# Patient Record
Sex: Female | Born: 1975 | Race: Black or African American | Hispanic: No | Marital: Married | Smoking: Never smoker
Health system: Southern US, Community
[De-identification: ages and names within clinical notes are randomized; demographics above are authoritative.]

## PROBLEM LIST (undated history)

## (undated) DIAGNOSIS — J302 Other seasonal allergic rhinitis: Secondary | ICD-10-CM

## (undated) DIAGNOSIS — L932 Other local lupus erythematosus: Secondary | ICD-10-CM

## (undated) DIAGNOSIS — E559 Vitamin D deficiency, unspecified: Secondary | ICD-10-CM

## (undated) DIAGNOSIS — T7840XA Allergy, unspecified, initial encounter: Secondary | ICD-10-CM

## (undated) DIAGNOSIS — J3089 Other allergic rhinitis: Secondary | ICD-10-CM

## (undated) DIAGNOSIS — E669 Obesity, unspecified: Secondary | ICD-10-CM

## (undated) DIAGNOSIS — L309 Dermatitis, unspecified: Secondary | ICD-10-CM

## (undated) DIAGNOSIS — K620 Anal polyp: Secondary | ICD-10-CM

## (undated) DIAGNOSIS — L409 Psoriasis, unspecified: Secondary | ICD-10-CM

## (undated) HISTORY — DX: Psoriasis, unspecified: L40.9

## (undated) HISTORY — DX: Other local lupus erythematosus: L93.2

## (undated) HISTORY — DX: Allergy, unspecified, initial encounter: T78.40XA

## (undated) HISTORY — DX: Obesity, unspecified: E66.9

## (undated) HISTORY — DX: Other allergic rhinitis: J30.89

## (undated) HISTORY — DX: Dermatitis, unspecified: L30.9

---
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
## 2000-07-20 ENCOUNTER — Encounter: Payer: Self-pay | Admitting: Emergency Medicine

## 2000-07-20 ENCOUNTER — Emergency Department (HOSPITAL_COMMUNITY): Admission: EM | Admit: 2000-07-20 | Discharge: 2000-07-20 | Payer: Self-pay | Admitting: Emergency Medicine

## 2009-01-15 HISTORY — PX: APPENDECTOMY: SHX54

## 2009-09-15 HISTORY — PX: APPENDECTOMY: SHX54

## 2009-10-12 ENCOUNTER — Inpatient Hospital Stay: Payer: Self-pay | Admitting: Surgery

## 2009-10-14 LAB — PATHOLOGY REPORT

## 2013-01-15 HISTORY — PX: ABDOMINOPLASTY: SUR9

## 2014-05-14 ENCOUNTER — Encounter: Payer: Self-pay | Admitting: *Deleted

## 2014-12-24 ENCOUNTER — Encounter: Payer: Self-pay | Admitting: Family Medicine

## 2015-01-21 ENCOUNTER — Ambulatory Visit (INDEPENDENT_AMBULATORY_CARE_PROVIDER_SITE_OTHER): Payer: 59 | Admitting: Family Medicine

## 2015-01-21 ENCOUNTER — Encounter: Payer: Self-pay | Admitting: Family Medicine

## 2015-01-21 VITALS — BP 122/84 | HR 93 | Temp 98.5°F | Resp 16 | Ht 67.0 in | Wt 235.1 lb

## 2015-01-21 DIAGNOSIS — Z13220 Encounter for screening for lipoid disorders: Secondary | ICD-10-CM

## 2015-01-21 DIAGNOSIS — Z0000 Encounter for general adult medical examination without abnormal findings: Secondary | ICD-10-CM | POA: Insufficient documentation

## 2015-01-21 DIAGNOSIS — J302 Other seasonal allergic rhinitis: Secondary | ICD-10-CM | POA: Insufficient documentation

## 2015-01-21 DIAGNOSIS — J3089 Other allergic rhinitis: Secondary | ICD-10-CM

## 2015-01-21 DIAGNOSIS — Z113 Encounter for screening for infections with a predominantly sexual mode of transmission: Secondary | ICD-10-CM

## 2015-01-21 DIAGNOSIS — R5383 Other fatigue: Secondary | ICD-10-CM | POA: Insufficient documentation

## 2015-01-21 DIAGNOSIS — Z136 Encounter for screening for cardiovascular disorders: Secondary | ICD-10-CM | POA: Diagnosis not present

## 2015-01-21 DIAGNOSIS — E669 Obesity, unspecified: Secondary | ICD-10-CM | POA: Insufficient documentation

## 2015-01-21 DIAGNOSIS — L932 Other local lupus erythematosus: Secondary | ICD-10-CM | POA: Insufficient documentation

## 2015-01-21 DIAGNOSIS — R203 Hyperesthesia: Secondary | ICD-10-CM | POA: Insufficient documentation

## 2015-01-21 DIAGNOSIS — IMO0001 Reserved for inherently not codable concepts without codable children: Secondary | ICD-10-CM | POA: Insufficient documentation

## 2015-01-21 HISTORY — DX: Other allergic rhinitis: J30.89

## 2015-01-21 HISTORY — DX: Other seasonal allergic rhinitis: J30.2

## 2015-01-21 NOTE — Patient Instructions (Signed)
Keep up your efforts for weight loss, you can do it!

## 2015-01-21 NOTE — Progress Notes (Signed)
Name: Aimee Medina   MRN: [id number]    DOB: [birth year]   Date:01/21/2015       Progress Note  Subjective  Chief Complaint  Chief Complaint  Patient presents with  . Annual Exam    HPI  Patient is here today for a Complete Female Physical Exam:  The patient has has no unusual complaints. Overall feels health needs are stable but is working on addressing weight and general health. Before she had children she was around 145 lbs. Diet is well balanced. In general does not exercise regularly but has joined a weight loss challenge. Sees dentist regularly and addresses vision concerns with ophthalmologist if applicable. In regards to sexual activity the patient is currently sexually active. Currently is not concerned about exposure to any STDs.   PAP testing to be done with Gynecologist. Tetanus shot done 05/20/2007 (during her last pregnancy).    Past Medical History  Diagnosis Date  . Allergy   . Dermatitis   . Cutaneous lupus erythematosus     Past Surgical History  Procedure Laterality Date  . Appendectomy  2011    Family History  Problem Relation Age of Onset  . Hypertension Father   . Cancer Father     prostate  . Stroke Father   . Allergies Daughter   . Allergies Son     Social History   Social History  . Marital Status: Single    Spouse Name: N/A  . Number of Children: N/A  . Years of Education: N/A   Occupational History  . Not on file.   Social History Main Topics  . Smoking status: Never Smoker   . Smokeless tobacco: Not on file  . Alcohol Use: No  . Drug Use: No  . Sexual Activity:    Partners: Male   Other Topics Concern  . Not on file   Social History Narrative  . No narrative on file   Outpatient Encounter Prescriptions as of 01/21/2015  Medication Sig  . cetirizine (ZYRTEC) 10 MG tablet Take 10 mg by mouth daily as needed.   No facility-administered encounter medications on file as of 01/21/2015.    No Known Allergies  ROS  CONSTITUTIONAL:  No significant weight changes, fever, chills, weakness. Occasional fatigue. HEENT:  - Eyes: No visual changes.  - Ears: No auditory changes. No pain.  - Nose: No sneezing, congestion, runny nose. - Throat: No sore throat. No changes in swallowing. SKIN: No rash or itching currently. CARDIOVASCULAR: No chest pain, chest pressure or chest discomfort. No palpitations or edema.  RESPIRATORY: No shortness of breath, cough or sputum.  GASTROINTESTINAL: No anorexia, nausea, vomiting. No changes in bowel habits. No abdominal pain or blood.  GENITOURINARY: No dysuria. No frequency. No discharge.  NEUROLOGICAL: No headache, dizziness, syncope, paralysis, ataxia, numbness or tingling in the extremities. No memory changes. No change in bowel or bladder control.  MUSCULOSKELETAL: Occasional knee joint pain. No muscle pain. HEMATOLOGIC: No anemia, bleeding or bruising.  LYMPHATICS: No enlarged lymph nodes.  PSYCHIATRIC: No change in mood. No change in sleep pattern.  ENDOCRINOLOGIC: No reports of sweating, cold or heat intolerance. No polyuria or polydipsia.   Objective  Filed Vitals:   01/21/15 0747  BP: 122/84  Pulse: 93  Temp: 98.5 F (36.9 C)  TempSrc: Oral  Resp: 16  Height: 5\' 7"  (1.702 m)  Weight: 235 lb 1.6 oz (106.641 kg)  SpO2: 96%   Body mass index is 36.81 kg/(m^2).  Depression screen [facility] 2/9  01/21/2015  Decreased Interest 0  Down, Depressed, Hopeless 0  PHQ - 2 Score 0     Physical Exam  Constitutional: Patient appears obese and well-nourished. In no distress.  HEENT:  - Head: Normocephalic and atraumatic.  - Ears: Bilateral TMs gray, no erythema or effusion - Nose: Nasal mucosa moist - Mouth/Throat: Oropharynx is clear and moist. No tonsillar hypertrophy or erythema. No post nasal drainage.  - Eyes: Conjunctivae clear, EOM movements normal. PERRLA. No scleral icterus.  Neck: Normal range of motion. Neck supple. No JVD present. No thyromegaly present.  Cardiovascular:  Normal rate, regular rhythm and normal heart sounds.  No murmur heard.  Pulmonary/Chest: Effort normal and breath sounds normal. No respiratory distress. Abdominal: Soft. Bowel sounds are normal, no distension. There is no tenderness. no masses BREAST: Bilateral breast exam normal with no masses, skin changes or nipple discharge FEMALE GENITALIA: Deferred Musculoskeletal: Normal range of motion bilateral UE and LE, no joint effusions. Peripheral vascular: Bilateral LE no edema. Neurological: CN II-XII grossly intact with no focal deficits. Alert and oriented to person, place, and time. Coordination, balance, strength, speech and gait are normal.  Skin: Skin is warm and dry. No rash noted. No erythema. Right upper back and lower back tattoo.  Psychiatric: Patient has a normal mood and affect. Behavior is normal in office today. Judgment and thought content normal in office today.   Assessment & Plan  1. Annual physical exam Discussed in detail all recommended preventative measures appropriate for age and gender now and in the future.   2. Obesity, Class II, BMI 35-39.9, isolated (HCC) Continue weight loss efforts discussed today.   3. Encounter for cholesteral screening for cardiovascular disease  - Lipid panel  4. Other fatigue Routine blood work, likely multifactorial. Exercise and weight loss will help.  - CBC with Differential/Platelet - Comprehensive metabolic panel - Lipid panel - TSH - HIV antibody  5. Screening for STD (sexually transmitted disease)  - HIV antibody

## 2015-02-01 DIAGNOSIS — R5383 Other fatigue: Secondary | ICD-10-CM | POA: Diagnosis not present

## 2015-02-01 DIAGNOSIS — Z13220 Encounter for screening for lipoid disorders: Secondary | ICD-10-CM | POA: Diagnosis not present

## 2015-02-01 DIAGNOSIS — Z136 Encounter for screening for cardiovascular disorders: Secondary | ICD-10-CM | POA: Diagnosis not present

## 2015-02-01 DIAGNOSIS — Z113 Encounter for screening for infections with a predominantly sexual mode of transmission: Secondary | ICD-10-CM | POA: Diagnosis not present

## 2015-02-02 ENCOUNTER — Encounter: Payer: Self-pay | Admitting: Family Medicine

## 2015-02-02 LAB — COMPREHENSIVE METABOLIC PANEL
ALT: 19 IU/L (ref 0–32)
AST: 22 IU/L (ref 0–40)
Albumin/Globulin Ratio: 1.6 (ref 1.1–2.5)
Albumin: 4.4 g/dL (ref 3.5–5.5)
Alkaline Phosphatase: 76 IU/L (ref 39–117)
BUN/Creatinine Ratio: 12 (ref 8–20)
BUN: 12 mg/dL (ref 6–20)
Bilirubin Total: 0.4 mg/dL (ref 0.0–1.2)
CO2: 22 mmol/L (ref 18–29)
Calcium: 9.2 mg/dL (ref 8.7–10.2)
Chloride: 102 mmol/L (ref 96–106)
Creatinine, Ser: 1.03 mg/dL — ABNORMAL HIGH (ref 0.57–1.00)
GFR calc Af Amer: 80 mL/min/{1.73_m2} (ref >59)
GFR calc non Af Amer: 70 mL/min/{1.73_m2} (ref >59)
Globulin, Total: 2.8 g/dL (ref 1.5–4.5)
Glucose: 81 mg/dL (ref 65–99)
Potassium: 4.7 mmol/L (ref 3.5–5.2)
Sodium: 140 mmol/L (ref 134–144)
Total Protein: 7.2 g/dL (ref 6.0–8.5)

## 2015-02-02 LAB — CBC WITH DIFFERENTIAL/PLATELET
Basophils Absolute: 0 10*3/uL (ref 0.0–0.2)
Basos: 0 %
EOS (ABSOLUTE): 0.1 10*3/uL (ref 0.0–0.4)
Eos: 2 %
Hematocrit: 41.3 % (ref 34.0–46.6)
Hemoglobin: 14 g/dL (ref 11.1–15.9)
Immature Grans (Abs): 0 10*3/uL (ref 0.0–0.1)
Immature Granulocytes: 0 %
Lymphocytes Absolute: 2 10*3/uL (ref 0.7–3.1)
Lymphs: 33 %
MCH: 29.4 pg (ref 26.6–33.0)
MCHC: 33.9 g/dL (ref 31.5–35.7)
MCV: 87 fL (ref 79–97)
Monocytes Absolute: 0.3 10*3/uL (ref 0.1–0.9)
Monocytes: 5 %
Neutrophils Absolute: 3.6 10*3/uL (ref 1.4–7.0)
Neutrophils: 60 %
Platelets: 316 10*3/uL (ref 150–379)
RBC: 4.77 x10E6/uL (ref 3.77–5.28)
RDW: 13.3 % (ref 12.3–15.4)
WBC: 6 10*3/uL (ref 3.4–10.8)

## 2015-02-02 LAB — LIPID PANEL
Chol/HDL Ratio: 3.4 ratio units (ref 0.0–4.4)
Cholesterol, Total: 179 mg/dL (ref 100–199)
HDL: 52 mg/dL (ref >39)
LDL Calculated: 116 mg/dL — ABNORMAL HIGH (ref 0–99)
Triglycerides: 56 mg/dL (ref 0–149)
VLDL Cholesterol Cal: 11 mg/dL (ref 5–40)

## 2015-02-02 LAB — HIV ANTIBODY (ROUTINE TESTING W REFLEX): HIV Screen 4th Generation wRfx: NONREACTIVE

## 2015-02-02 LAB — TSH: TSH: 2.23 u[IU]/mL (ref 0.450–4.500)

## 2015-03-30 ENCOUNTER — Other Ambulatory Visit: Payer: Self-pay | Admitting: Family Medicine

## 2015-03-30 DIAGNOSIS — Z20828 Contact with and (suspected) exposure to other viral communicable diseases: Secondary | ICD-10-CM

## 2015-03-30 MED ORDER — OSELTAMIVIR PHOSPHATE 75 MG PO CAPS: 75.0000 mg | ORAL_CAPSULE | Freq: Every day | ORAL | Status: DC

## 2015-04-15 DIAGNOSIS — H52223 Regular astigmatism, bilateral: Secondary | ICD-10-CM | POA: Diagnosis not present

## 2015-04-15 DIAGNOSIS — H5203 Hypermetropia, bilateral: Secondary | ICD-10-CM | POA: Diagnosis not present

## 2015-06-02 ENCOUNTER — Ambulatory Visit (INDEPENDENT_AMBULATORY_CARE_PROVIDER_SITE_OTHER): Payer: 59 | Admitting: Primary Care

## 2015-06-02 ENCOUNTER — Encounter: Payer: Self-pay | Admitting: Primary Care

## 2015-06-02 VITALS — BP 122/80 | HR 76 | Temp 97.6°F | Ht 67.0 in | Wt 227.0 lb

## 2015-06-02 DIAGNOSIS — R5383 Other fatigue: Secondary | ICD-10-CM | POA: Diagnosis not present

## 2015-06-02 DIAGNOSIS — E669 Obesity, unspecified: Secondary | ICD-10-CM

## 2015-06-02 DIAGNOSIS — R21 Rash and other nonspecific skin eruption: Secondary | ICD-10-CM

## 2015-06-02 LAB — VITAMIN B12: Vitamin B-12: 704 pg/mL (ref 211–911)

## 2015-06-02 MED ORDER — PREDNISONE 10 MG PO TABS: ORAL_TABLET | ORAL | Status: DC

## 2015-06-02 NOTE — Patient Instructions (Signed)
Complete lab work prior to leaving today. I will notify you of your results once received.   Stop by the front desk and speak with either Rosaria Ferries or Ebony Hail regarding your referral to Dermatology.  Start Prednisone tablets for rash. Take 3 tablets for 2 days, then 2 tablets for 2 days, then 1 tablet for 2 days.  Work to reduce processed carbohydrates, sweets, and fast food consumption.   Take a look at the nutritional facts online prior to eating out. A lot of salads and sandwiches that claim to be healthy are loaded with calories.  Ensure you are consuming 64 ounces of water daily.  Try to eat no more than 1400 calories daily in order to lose weight.  Congratulations on your weight loss, keep up the good work!  Follow up in January for your annual physical.  It was a pleasure to meet you today! Please don't hesitate to call me with any questions. Welcome to [facility]!

## 2015-06-02 NOTE — Assessment & Plan Note (Signed)
Endorses fatigue for years. Work up on January unremarkable. Will check vitamin D and B12 today. Exam unremarkable. If labs normal then likely due to her long work hours and family responsibility. Discussed to ensure she is spending time on herself.

## 2015-06-02 NOTE — Progress Notes (Signed)
Subjective:    Patient ID: Aimee Medina, female    DOB: [birth year], 40 y.o.   MRN: [id number]  HPI  Aimee Medina is a 40 year old female who presents today to establish care and discuss the problems mentioned below. Will obtain old records.  1) Obesity: Struggled with for the past 7 years. Over the past 4 months she's been exercising and has improved her diet. She's lost 8 pounds since January but is frustrated today as she has not lost much weight since.   She endorses a healthy diet: Breakfast: Dry cereal, yogurt Lunch: Salad, grilled chicken sandwich, eating out, smoothies Dinner: Chicken, salmon, [place], vegetables, rice Snacks: None Desserts: 2-3 times weekly Beverages: Water  Exercise: Exercise tape at home 2 days weekly, aerobic class once weekly.  2) Rash: Present for the past 2 weeks and is located to her bilateral lower extremities, abdomen, anterior and posterior chest, and face. She called a dermatology office in [facility] and was put on a wait list to be seen. She denies changes in soaps, clothing, food, etc. She's not been working in the yard. She was diagnosed with cutaneous lupus erythematosus in 2014. She's had this rash occur in the past before but not yet to this level. Denies itching. Occasionally painful. She's not applied anything OTC.  3) Fatigue: Present for years. She will feel wake up feeling tired, reports hair loss, and difficulty losing weight. Denies cold inolerance. Labs completed in January including TSH, CBC, CMP were unremarkable. She denies depression. Attributes her fatigue to a full time job and 2 children at home.  Review of Systems  Constitutional: Positive for fever. Negative for unexpected weight change.  Respiratory: Negative for shortness of breath.   Cardiovascular: Negative for chest pain.  Skin: Positive for rash.  Neurological: Negative for dizziness.       Past Medical History  Diagnosis Date  . Allergy   . Dermatitis   . Cutaneous lupus  erythematosus      Social History   Social History  . Marital Status: Married    Spouse Name: N/A  . Number of Children: N/A  . Years of Education: N/A   Occupational History  . Not on file.   Social History Main Topics  . Smoking status: Never Smoker   . Smokeless tobacco: Not on file  . Alcohol Use: No  . Drug Use: No  . Sexual Activity:    Partners: Male   Other Topics Concern  . Not on file   Social History Narrative   Married.   2 children.   Work's at [employer].   Enjoys relaxing, pedicures, spending time with family.     Past Surgical History  Procedure Laterality Date  . Appendectomy  2011    Family History  Problem Relation Age of Onset  . Hypertension Father   . Cancer Father     prostate  . Stroke Father   . Allergies Daughter   . Allergies Son     No Known Allergies  Current Outpatient Prescriptions on File Prior to Visit  Medication Sig Dispense Refill  . cetirizine (ZYRTEC) 10 MG tablet Take 10 mg by mouth daily as needed.     No current facility-administered medications on file prior to visit.    BP 122/80 mmHg  Pulse 76  Temp(Src) 97.6 F (36.4 C) (Oral)  Ht 5\' 7"  (1.702 m)  Wt 227 lb (102.967 kg)  BMI 35.55 kg/m2  SpO2 97%  LMP 05/24/2015  Objective:   Physical Exam  Constitutional: She appears well-nourished.  Neck: Neck supple.  Cardiovascular: Normal rate and regular rhythm.   Pulmonary/Chest: Effort normal and breath sounds normal.  Skin: Skin is warm and dry.  Wide spread rash with darker skin toned spots to lower extremities, anterior and posterior trunk, and face. Flat, no erythema, non tender, no drainage.  Psychiatric: She has a normal mood and affect.          Assessment & Plan:  Rash:  Located wide spread to most of body. Does not appear to be contact dermatitis, cancerous, shingles or allergic reaction. Unsure of etiology. Will treat with prednisone taper and send to dermatology for further  evaluation.

## 2015-06-02 NOTE — Assessment & Plan Note (Signed)
Weight loss of 8 pounds since January, commended her on these efforts and encouraged her to continue. Discussed reduce calorie diet and to read nutritional information for restaurants prior to eating.

## 2015-06-02 NOTE — Progress Notes (Signed)
Pre visit review using our clinic review tool, if applicable. No additional management support is needed unless otherwise documented below in the visit note. 

## 2015-06-03 LAB — VITAMIN D 25 HYDROXY (VIT D DEFICIENCY, FRACTURES): VITD: 13.27 ng/mL — AB (ref 30.00–100.00)

## 2015-06-07 ENCOUNTER — Other Ambulatory Visit: Payer: Self-pay | Admitting: Primary Care

## 2015-06-07 ENCOUNTER — Encounter: Payer: Self-pay | Admitting: Primary Care

## 2015-06-07 DIAGNOSIS — E559 Vitamin D deficiency, unspecified: Secondary | ICD-10-CM

## 2015-06-07 MED ORDER — VITAMIN D (ERGOCALCIFEROL) 1.25 MG (50000 UNIT) PO CAPS: ORAL_CAPSULE | ORAL | Status: DC

## 2015-06-22 DIAGNOSIS — L738 Other specified follicular disorders: Secondary | ICD-10-CM | POA: Diagnosis not present

## 2015-07-16 DIAGNOSIS — G4730 Sleep apnea, unspecified: Secondary | ICD-10-CM | POA: Diagnosis not present

## 2015-07-17 DIAGNOSIS — G4730 Sleep apnea, unspecified: Secondary | ICD-10-CM | POA: Diagnosis not present

## 2015-09-21 ENCOUNTER — Encounter: Payer: Self-pay | Admitting: Primary Care

## 2015-09-21 DIAGNOSIS — L309 Dermatitis, unspecified: Secondary | ICD-10-CM

## 2015-09-28 DIAGNOSIS — L309 Dermatitis, unspecified: Secondary | ICD-10-CM | POA: Diagnosis not present

## 2015-09-28 DIAGNOSIS — L308 Other specified dermatitis: Secondary | ICD-10-CM | POA: Diagnosis not present

## 2015-09-28 DIAGNOSIS — M3210 Systemic lupus erythematosus, organ or system involvement unspecified: Secondary | ICD-10-CM | POA: Diagnosis not present

## 2016-03-27 ENCOUNTER — Other Ambulatory Visit: Payer: Self-pay | Admitting: Primary Care

## 2016-03-27 ENCOUNTER — Encounter: Payer: Self-pay | Admitting: Primary Care

## 2016-03-27 DIAGNOSIS — Z20828 Contact with and (suspected) exposure to other viral communicable diseases: Secondary | ICD-10-CM

## 2016-03-27 MED ORDER — OSELTAMIVIR PHOSPHATE 75 MG PO CAPS: 75.0000 mg | ORAL_CAPSULE | Freq: Every day | ORAL | 0 refills | Status: DC

## 2016-06-12 DIAGNOSIS — H5201 Hypermetropia, right eye: Secondary | ICD-10-CM | POA: Diagnosis not present

## 2016-08-14 ENCOUNTER — Ambulatory Visit (INDEPENDENT_AMBULATORY_CARE_PROVIDER_SITE_OTHER): Payer: 59 | Admitting: Obstetrics & Gynecology

## 2016-08-14 ENCOUNTER — Encounter: Payer: Self-pay | Admitting: Obstetrics & Gynecology

## 2016-08-14 VITALS — BP 131/70 | HR 84 | Ht 67.0 in | Wt 236.0 lb

## 2016-08-14 DIAGNOSIS — Z1151 Encounter for screening for human papillomavirus (HPV): Secondary | ICD-10-CM | POA: Diagnosis not present

## 2016-08-14 DIAGNOSIS — Z113 Encounter for screening for infections with a predominantly sexual mode of transmission: Secondary | ICD-10-CM

## 2016-08-14 DIAGNOSIS — Z01419 Encounter for gynecological examination (general) (routine) without abnormal findings: Secondary | ICD-10-CM | POA: Diagnosis not present

## 2016-08-14 DIAGNOSIS — Z124 Encounter for screening for malignant neoplasm of cervix: Secondary | ICD-10-CM | POA: Diagnosis not present

## 2016-08-14 NOTE — Patient Instructions (Signed)
Preventive Care 18-39 Years, Female Preventive care refers to lifestyle choices and visits with your health care provider that can promote health and wellness. What does preventive care include?  A yearly physical exam. This is also called an annual well check.  Dental exams once or twice a year.  Routine eye exams. Ask your health care provider how often you should have your eyes checked.  Personal lifestyle choices, including: ? Daily care of your teeth and gums. ? Regular physical activity. ? Eating a healthy diet. ? Avoiding tobacco and drug use. ? Limiting alcohol use. ? Practicing safe sex. ? Taking vitamin and mineral supplements as recommended by your health care provider. What happens during an annual well check? The services and screenings done by your health care provider during your annual well check will depend on your age, overall health, lifestyle risk factors, and family history of disease. Counseling Your health care provider may ask you questions about your:  Alcohol use.  Tobacco use.  Drug use.  Emotional well-being.  Home and relationship well-being.  Sexual activity.  Eating habits.  Work and work Statistician.  Method of birth control.  Menstrual cycle.  Pregnancy history.  Screening You may have the following tests or measurements:  Height, weight, and BMI.  Diabetes screening. This is done by checking your blood sugar (glucose) after you have not eaten for a while (fasting).  Blood pressure.  Lipid and cholesterol levels. These may be checked every 5 years starting at age 66.  Skin check.  Hepatitis C blood test.  Hepatitis B blood test.  Sexually transmitted disease (STD) testing.  BRCA-related cancer screening. This may be done if you have a family history of breast, ovarian, tubal, or peritoneal cancers.  Pelvic exam and Pap test. This may be done every 3 years starting at age 40. Starting at age 59, this may be done every 5  years if you have a Pap test in combination with an HPV test.  Discuss your test results, treatment options, and if necessary, the need for more tests with your health care provider. Vaccines Your health care provider may recommend certain vaccines, such as:  Influenza vaccine. This is recommended every year.  Tetanus, diphtheria, and acellular pertussis (Tdap, Td) vaccine. You may need a Td booster every 10 years.  Varicella vaccine. You may need this if you have not been vaccinated.  HPV vaccine. If you are 69 or younger, you may need three doses over 6 months.  Measles, mumps, and rubella (MMR) vaccine. You may need at least one dose of MMR. You may also need a second dose.  Pneumococcal 13-valent conjugate (PCV13) vaccine. You may need this if you have certain conditions and were not previously vaccinated.  Pneumococcal polysaccharide (PPSV23) vaccine. You may need one or two doses if you smoke cigarettes or if you have certain conditions.  Meningococcal vaccine. One dose is recommended if you are age 27-21 years and a first-year college student living in a residence hall, or if you have one of several medical conditions. You may also need additional booster doses.  Hepatitis A vaccine. You may need this if you have certain conditions or if you travel or work in places where you may be exposed to hepatitis A.  Hepatitis B vaccine. You may need this if you have certain conditions or if you travel or work in places where you may be exposed to hepatitis B.  Haemophilus influenzae type b (Hib) vaccine. You may need this if  you have certain risk factors.  Talk to your health care provider about which screenings and vaccines you need and how often you need them. This information is not intended to replace advice given to you by your health care provider. Make sure you discuss any questions you have with your health care provider. Document Released: 02/27/2001 Document Revised: 09/21/2015  Document Reviewed: 11/02/2014 Elsevier Interactive Patient Education  2017 [employer].

## 2016-08-14 NOTE — Progress Notes (Signed)
GYNECOLOGY ANNUAL PREVENTATIVE CARE ENCOUNTER NOTE  Subjective:   Aimee Medina is a 41 y.o. [phone] female here for a routine annual gynecologic exam.  Current complaints: none.   Denies abnormal vaginal bleeding, discharge, pelvic pain, problems with intercourse or other gynecologic concerns.    Gynecologic History Patient's last menstrual period was 07/27/2016. Contraception: none Last Pap: 2016. Results were: normal  Obstetric History OB History  Gravida Para Term Preterm AB Living  4 2 2  0 2 2  SAB TAB Ectopic Multiple Live Births  1 1 0 0 2    # Outcome Date GA Lbr Len/2nd Weight Sex Delivery Anes PTL Lv  4 TAB      TAB     3 SAB      SAB     2 Term      Vag-Spont   LIV  1 Term      Vag-Spont   LIV      Past Medical History:  Diagnosis Date  . Allergy   . Cutaneous lupus erythematosus   . Dermatitis     Past Surgical History:  Procedure Laterality Date  . ABDOMINOPLASTY  2015  . APPENDECTOMY  2011    Current Outpatient Prescriptions on File Prior to Visit  Medication Sig Dispense Refill  . cetirizine (ZYRTEC) 10 MG tablet Take 10 mg by mouth daily as needed.    Marland Kitchen oseltamivir (TAMIFLU) 75 MG capsule Take 1 capsule (75 mg total) by mouth daily. 7 capsule 0  . predniSONE (DELTASONE) 10 MG tablet Take 3 tablets for 2 days, then 2 tablets for 2 days, then 1 tablet for 2 days. 12 tablet 0  . Vitamin D, Ergocalciferol, (DRISDOL) 50000 units CAPS capsule Take 1 capsule by mouth once weekly for 12 weeks. 12 capsule 0   No current facility-administered medications on file prior to visit.     No Known Allergies  Social History   Social History  . Marital status: Married    Spouse name: N/A  . Number of children: N/A  . Years of education: N/A   Occupational History  . Not on file.   Social History Main Topics  . Smoking status: Never Smoker  . Smokeless tobacco: Never Used  . Alcohol use No  . Drug use: No  . Sexual activity: Yes    Partners: Male    Other Topics Concern  . Not on file   Social History Narrative   Married.   2 children.   Work's at [employer].   Enjoys relaxing, pedicures, spending time with family.     Family History  Problem Relation Age of Onset  . Hypertension Father   . Stroke Father   . Prostate cancer Father 38       prostate  . Allergies Daughter   . Allergies Son   . Pancreatic cancer Maternal Grandmother 57  . Colon cancer Maternal Grandfather 50  . Prostate cancer Paternal Grandfather 36    The following portions of the patient's history were reviewed and updated as appropriate: allergies, current medications, past family history, past medical history, past social history, past surgical history and problem list.  Review of Systems Pertinent items noted in HPI and remainder of comprehensive ROS otherwise negative.   Objective:  BP 131/70   Pulse 84   Ht 5\' 7"  (1.702 m)   Wt 236 lb (107 kg)   LMP 07/27/2016   BMI 36.96 kg/m  CONSTITUTIONAL: Well-developed, well-nourished female in no acute distress.  HENT:  Normocephalic, atraumatic, External right and left ear normal. Oropharynx is clear and moist EYES: Conjunctivae and EOM are normal. Pupils are equal, round, and reactive to light. No scleral icterus.  NECK: Normal range of motion, supple, no masses.  Normal thyroid.  SKIN: Skin is warm and dry. No rash noted. Not diaphoretic. No erythema. No pallor. NEUROLOGIC: Alert and oriented to person, place, and time. Normal reflexes, muscle tone coordination. No cranial nerve deficit noted. PSYCHIATRIC: Normal mood and affect. Normal behavior. Normal judgment and thought content. CARDIOVASCULAR: Normal heart rate noted, regular rhythm RESPIRATORY: Clear to auscultation bilaterally. Effort and breath sounds normal, no problems with respiration noted. BREASTS: Symmetric in size. No masses, skin changes, nipple drainage, or lymphadenopathy. ABDOMEN: Soft, normal bowel sounds, no distention noted.   No tenderness, rebound or guarding.  PELVIC: Normal appearing external genitalia; normal appearing vaginal mucosa and cervix.  No abnormal discharge noted.  Pap smear obtained.  Normal uterine size, no other palpable masses, no uterine or adnexal tenderness. MUSCULOSKELETAL: Normal range of motion. No tenderness.  No cyanosis, clubbing, or edema.  2+ distal pulses.   Assessment and Plan:  1. Encounter for gynecological examination with Papanicolaou smear of cervix Patient desired STI screen and other health maintenance labs.  - Cytology - PAP - HIV antibody - Hepatitis B surface antigen - Hepatitis C antibody - RPR - Lipid panel - CBC - CMP and Liver - VITAMIN D 25 Hydroxy (Vit-D Deficiency, Fractures) - TSH Will follow up results of pap smear and manage accordingly. Routine preventative health maintenance measures emphasized. Please refer to After Visit Summary for other counseling recommendations.     Verita Schneiders, MD, [place] Attending Obstetrician & Gynecologist, [place] for [facility]

## 2016-08-15 DIAGNOSIS — Z01419 Encounter for gynecological examination (general) (routine) without abnormal findings: Secondary | ICD-10-CM | POA: Diagnosis not present

## 2016-08-16 LAB — CMP AND LIVER
ALT: 12 IU/L (ref 0–32)
AST: 16 IU/L (ref 0–40)
Albumin: 4.2 g/dL (ref 3.5–5.5)
Alkaline Phosphatase: 82 IU/L (ref 39–117)
BUN: 13 mg/dL (ref 6–20)
Bilirubin Total: 0.5 mg/dL (ref 0.0–1.2)
Bilirubin, Direct: 0.13 mg/dL (ref 0.00–0.40)
CHLORIDE: 100 mmol/L (ref 96–106)
CO2: 23 mmol/L (ref 20–29)
Calcium: 9.1 mg/dL (ref 8.7–10.2)
Creatinine, Ser: 1.04 mg/dL — ABNORMAL HIGH (ref 0.57–1.00)
GFR calc Af Amer: 78 mL/min/{1.73_m2} (ref >59)
GFR calc non Af Amer: 68 mL/min/{1.73_m2} (ref >59)
GLUCOSE: 82 mg/dL (ref 65–99)
POTASSIUM: 5.2 mmol/L (ref 3.5–5.2)
SODIUM: 137 mmol/L (ref 134–144)
Total Protein: 7 g/dL (ref 6.0–8.5)

## 2016-08-16 LAB — RPR: RPR: NONREACTIVE

## 2016-08-16 LAB — HEPATITIS C ANTIBODY: Hep C Virus Ab: <0.1 s/co ratio (ref 0.0–0.9)

## 2016-08-16 LAB — CBC
Hematocrit: 40.5 % (ref 34.0–46.6)
Hemoglobin: 13.4 g/dL (ref 11.1–15.9)
MCH: 29.2 pg (ref 26.6–33.0)
MCHC: 33.1 g/dL (ref 31.5–35.7)
MCV: 88 fL (ref 79–97)
PLATELETS: 297 10*3/uL (ref 150–379)
RBC: 4.59 x10E6/uL (ref 3.77–5.28)
RDW: 13.4 % (ref 12.3–15.4)
WBC: 5.4 10*3/uL (ref 3.4–10.8)

## 2016-08-16 LAB — VITAMIN D 25 HYDROXY (VIT D DEFICIENCY, FRACTURES): VIT D 25 HYDROXY: 21 ng/mL — AB (ref 30.0–100.0)

## 2016-08-16 LAB — LIPID PANEL
CHOLESTEROL TOTAL: 174 mg/dL (ref 100–199)
Chol/HDL Ratio: 3.3 ratio (ref 0.0–4.4)
HDL: 52 mg/dL (ref >39)
LDL Calculated: 109 mg/dL — ABNORMAL HIGH (ref 0–99)
TRIGLYCERIDES: 66 mg/dL (ref 0–149)
VLDL Cholesterol Cal: 13 mg/dL (ref 5–40)

## 2016-08-16 LAB — TSH: TSH: 2.77 u[IU]/mL (ref 0.450–4.500)

## 2016-08-16 LAB — HIV ANTIBODY (ROUTINE TESTING W REFLEX): HIV SCREEN 4TH GENERATION: NONREACTIVE

## 2016-08-16 LAB — HEPATITIS B SURFACE ANTIGEN: HEP B S AG: NEGATIVE

## 2016-08-17 LAB — CYTOLOGY - PAP
Chlamydia: NEGATIVE
DIAGNOSIS: NEGATIVE
DIAGNOSIS: REACTIVE
HPV: NOT DETECTED
Neisseria Gonorrhea: NEGATIVE
Trichomonas: NEGATIVE

## 2017-01-14 ENCOUNTER — Ambulatory Visit (INDEPENDENT_AMBULATORY_CARE_PROVIDER_SITE_OTHER): Payer: 59 | Admitting: Nurse Practitioner

## 2017-01-14 ENCOUNTER — Encounter: Payer: Self-pay | Admitting: Nurse Practitioner

## 2017-01-14 VITALS — BP 122/83 | HR 82 | Temp 98.3°F | Ht 67.0 in | Wt 234.6 lb

## 2017-01-14 DIAGNOSIS — Z30011 Encounter for initial prescription of contraceptive pills: Secondary | ICD-10-CM | POA: Diagnosis not present

## 2017-01-14 DIAGNOSIS — L932 Other local lupus erythematosus: Secondary | ICD-10-CM

## 2017-01-14 DIAGNOSIS — N926 Irregular menstruation, unspecified: Secondary | ICD-10-CM | POA: Diagnosis not present

## 2017-01-14 DIAGNOSIS — E669 Obesity, unspecified: Secondary | ICD-10-CM | POA: Diagnosis not present

## 2017-01-14 LAB — POCT URINE PREGNANCY: Preg Test, Ur: NEGATIVE

## 2017-01-14 MED ORDER — NORETHINDRONE ACET-ETHINYL EST 1-20 MG-MCG PO TABS: 1.0000 | ORAL_TABLET | Freq: Every day | ORAL | 11 refills | Status: DC

## 2017-01-14 MED ORDER — NORETHIN-ETH ESTRAD-FE BIPHAS 1 MG-10 MCG / 10 MCG PO TABS: 1.0000 | ORAL_TABLET | Freq: Every day | ORAL | 11 refills | Status: DC

## 2017-01-14 NOTE — Patient Instructions (Addendum)
Aimee Medina, Thank you for coming in to clinic today.  1. For your irregular periods: - Continue tracking your cycle. - START lo-loestrin 1-10 once daily.  Repeat your pills by taking in order by pill pack.  Start with new pack every 28 days.  2. Weight loss - Dr. Migdalia Dk office should contact you.  3. Referral to dermatology for continuing care has been placed.   Please schedule a follow-up appointment with [name], AGNP. Return in about 7 months (around 08/14/2017) for Annual Physical.  If you have any other questions or concerns, please feel free to call the clinic or send a message through [place]. You may also schedule an earlier appointment if necessary.  You will receive a survey after today's visit either digitally by e-mail or paper by [employer]. Your experiences and feedback matter to [place].  Please respond so we know how we are doing as we provide care for you.   [name], DNP, AGNP-BC Adult Gerontology Nurse Practitioner [place]

## 2017-01-14 NOTE — Progress Notes (Signed)
Subjective:    Patient ID: Aimee Medina, female    DOB: [birth year], 41 y.o.   MRN: [id number]  Aimee Medina is a 41 y.o. female presenting on 01/14/2017 for Establish Care (need referral weight loss program Dr. Leafy Ro, referral Dermatology Dr. Phillip Heal)   HPI [facility] Provider Pt last seen by PCP about 2 years ago.  Obtain records from [facility].  Pt's last PCP was Alma Friendly, NP at [facility]. Likes [employer] as well and needed to get into network for new focus plan insurance.  Obesity More significant weight gain steady over last 6 years with move to [place] from [place] and had much more supportive environment. - Pt would like to start the program for weight loss with Dr. Leafy Ro from family medicine weight management practice.  The program is based on lifestyle changes and regular followup frequently for account.   Dermatology: diagnosed with lupus  - feels is somewhat connected to weight gain since she didn't have symptoms prior to weight gain.  Plaquenil caused dizziness, so she stopped. Is not currently bothered by skin symptoms.  No other current or prior manifestations of lupus are noted.  Irregular Menses Pt reports new onset of irregular menses over last 6 months. Now has very irregular cycle.  Pt not currently using contraception and does not desire additional children.  Has not liked symptoms from standard hormonal methods in past, so requests very low dose hormone pill today. Social History   Tobacco Use  . Smoking status: Never Smoker  . Smokeless tobacco: Never Used  Substance Use Topics  . Alcohol use: No    Alcohol/week: 0.0 oz    Comment: very rare  . Drug use: No    Review of Systems  Constitutional: Positive for fatigue.  HENT: Negative.   Respiratory: Negative.  Negative for shortness of breath.   Cardiovascular: Negative for chest pain and leg swelling.  Gastrointestinal: Negative for constipation and diarrhea.  Endocrine: Negative.   Genitourinary:  Positive for menstrual problem (irregular menses). Negative for difficulty urinating.  Musculoskeletal: Negative.   Skin: Positive for color change and rash.  Allergic/Immunologic: Positive for environmental allergies.  Psychiatric/Behavioral: Negative.    Per HPI unless specifically indicated above     Objective:    BP 122/83 (BP Location: Right Arm, Patient Position: Sitting, Cuff Size: Normal)   Pulse 82   Temp 98.3 F (36.8 C) (Oral)   Ht 5\' 7"  (1.702 m)   Wt 234 lb 9.6 oz (106.4 kg)   LMP 12/03/2016 (Within Days) Comment: not currently on contraception  BMI 36.74 kg/m   Wt Readings from Last 3 Encounters:  01/14/17 234 lb 9.6 oz (106.4 kg)  08/14/16 236 lb (107 kg)  06/02/15 227 lb (103 kg)    Physical Exam  General - obese, well-appearing, NAD HEENT - Normocephalic, atraumatic Neck - supple, non-tender, no LAD Heart - RRR, no murmurs heard Lungs - Clear throughout all lobes, no wheezing, crackles, or rhonchi. Normal work of breathing. Extremeties - non-tender, trace pedal edema R>L, cap refill < 2 seconds, peripheral pulses intact +2 bilaterally Skin - warm, dry, lupus changes to skin on face, abdomen and back. 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  No other current signs of systemic lupus. Pt requests referral for continuation of dermatology care on new [employer] insurance plan.  Plan: 1. Referral Dr. Aubery Lapping dermatology. 2.  Discussed need to continue to monitor kidney function with increased creatinine for possibility of systemic lupus. 3.  Follow-up with annual labs in  July.   Relevant Orders   Ambulatory referral to Dermatology     Other   Obesity, Class II, BMI 35-39.9, isolated Currently in obesity class II without comorbidities.  Patient motivated for weight loss and desires to be referred to medical weight management with Dr. Leafy Ro in [place].  Plan: 1.  Referral to Dr. Leafy Ro for medical weight management. 2.  Encourage patient to continue lifestyle management. 3.  Follow-up as needed.   Relevant Orders   Amb Ref to Medical Weight Management    Other Visit Diagnoses    Encounter for initial prescription of contraceptive pills    -  Primary Irregular menses    Patient reports irregular menses over the last 6 months.  Has not had a menstrual period in at least 6 weeks.  Patient not currently using contraception and does not desire any other pregnancies. -Possible early menopause  Plan: 1.  Start lo-Loestrin once daily times 21 days then take an extra pills for 7 days.  Start new pack after 28 days.  -Prior authorization will be over acquired with failure of to generic OCPs.   - Discontinue lo-Loestrin. Start Loestrin. 2.  POCT pregnancy test today result negative 3.  Follow-up annually.   Relevant Medications   norethindrone-ethinyl estradiol (LOESTRIN 1/20, 21,) 1-20 MG-MCG tablet   Other Relevant Orders   POCT urine pregnancy (Completed)          Meds ordered this encounter  Medications  . DISCONTD: Norethindrone-Ethinyl Estradiol-Fe Biphas (LO LOESTRIN FE) 1 MG-10 MCG / 10 MCG tablet    Sig: Take 1 tablet by mouth daily.    Dispense:  1 Package    Refill:  11    Order Specific Question:   Supervising Provider    Answer:   Olin Hauser [[id number]]  . norethindrone-ethinyl estradiol (LOESTRIN 1/20, 21,) 1-20 MG-MCG tablet    Sig: Take 1 tablet by mouth daily.    Dispense:  1 Package    Refill:  11    Order Specific Question:   Supervising Provider    Answer:   Olin Hauser [[id number]]     Follow up  plan: Return in about 7 months (around 08/14/2017) for Annual Physical.   [name], DNP, AGPCNP-BC Adult Gerontology Primary Care Nurse Practitioner [facility] 01/14/2017, 4:55 PM

## 2017-03-13 ENCOUNTER — Encounter (INDEPENDENT_AMBULATORY_CARE_PROVIDER_SITE_OTHER): Payer: Self-pay

## 2017-03-21 ENCOUNTER — Encounter (INDEPENDENT_AMBULATORY_CARE_PROVIDER_SITE_OTHER): Payer: Self-pay

## 2017-03-21 ENCOUNTER — Ambulatory Visit (INDEPENDENT_AMBULATORY_CARE_PROVIDER_SITE_OTHER): Payer: Self-pay | Admitting: Family Medicine

## 2017-03-27 ENCOUNTER — Ambulatory Visit (INDEPENDENT_AMBULATORY_CARE_PROVIDER_SITE_OTHER): Payer: No Typology Code available for payment source | Admitting: Family Medicine

## 2017-03-27 ENCOUNTER — Encounter (INDEPENDENT_AMBULATORY_CARE_PROVIDER_SITE_OTHER): Payer: Self-pay | Admitting: Family Medicine

## 2017-03-27 VITALS — BP 136/86 | HR 75 | Temp 98.0°F | Ht 67.0 in | Wt 228.0 lb

## 2017-03-27 DIAGNOSIS — E559 Vitamin D deficiency, unspecified: Secondary | ICD-10-CM

## 2017-03-27 DIAGNOSIS — Z9189 Other specified personal risk factors, not elsewhere classified: Secondary | ICD-10-CM | POA: Diagnosis not present

## 2017-03-27 DIAGNOSIS — R5383 Other fatigue: Secondary | ICD-10-CM | POA: Diagnosis not present

## 2017-03-27 DIAGNOSIS — R0602 Shortness of breath: Secondary | ICD-10-CM

## 2017-03-27 DIAGNOSIS — Z6835 Body mass index (BMI) 35.0-35.9, adult: Secondary | ICD-10-CM | POA: Diagnosis not present

## 2017-03-27 DIAGNOSIS — Z0289 Encounter for other administrative examinations: Secondary | ICD-10-CM

## 2017-03-27 DIAGNOSIS — Z1331 Encounter for screening for depression: Secondary | ICD-10-CM

## 2017-03-27 HISTORY — DX: Vitamin D deficiency, unspecified: E55.9

## 2017-03-27 NOTE — Progress Notes (Signed)
.  Office: [phone]  /  Fax: [phone]   HPI:   Chief Complaint: Aimee Medina (MR# [id number]) is a 42 y.o. female who presents on 03/27/2017 for obesity evaluation and treatment. Current BMI is Body mass index is 35.71 kg/m.Aimee Medina has struggled with obesity for years and has been unsuccessful in either losing weight or maintaining long term weight loss. Aimee Medina attended our information session and states she is currently in the action stage of change and ready to dedicate time achieving and maintaining a healthier weight.  Aimee Medina states her family eats meals together she thinks her family will eat healthier with  her her desired weight loss is 67 lbs she started gaining weight after 2nd pregnancy her heaviest weight ever was 237 lbs. she is a picky eater and doesn't like to eat healthier foods  she has significant food cravings issues  she skips meals frequently she is frequently drinking liquids with calories she frequently makes poor food choices she frequently eats larger portions than normal  she has binge eating behaviors she struggles with emotional eating   Aimee Medina had abdominoplasty and mistakenly thinks this was weight loss surgery.   Aimee Medina feels her energy is lower than it should be. This has worsened with weight gain and has not worsened recently. Aimee Medina admits to daytime somnolence and admits to waking up still tired. Patient is at risk for obstructive sleep apnea. Patent has a history of symptoms of daytime Aimee, morning Aimee and morning headache. Patient generally gets 5 or 6 hours of sleep per night, and states they generally have restful sleep. Snoring is present. Apneic episodes are not present. Epworth Sleepiness Score is 8  Dyspnea on exertion Aimee Medina notes increasing shortness of breath with exercising and seems to be worsening over time with weight gain. She notes getting out of breath sooner with activity than she used to. This has not gotten worse  recently. Aimee Medina denies orthopnea.  Vitamin D deficiency Aimee Medina has a diagnosis of vitamin D deficiency. She is not currently taking vit D. Aimee Medina admits Aimee and denies nausea, vomiting or muscle weakness.   Ref. Range 08/15/2016 08:52  Vitamin D, 25-Hydroxy Latest Ref Range: 30.0 - 100.0 ng/mL 21.0 (L)   At risk for cardiovascular disease Aimee Medina is at a higher than average risk for cardiovascular disease due to obesity. She currently denies any chest pain.  Depression Screen Aimee Medina's Food and Mood (modified PHQ-9) score was  Depression screen PHQ 2/9 03/27/2017  Decreased Interest 3  Down, Depressed, Hopeless 3  PHQ - 2 Score 6  Altered sleeping 3  Tired, decreased energy 3  Change in appetite 1  Feeling bad or failure about yourself  3  Trouble concentrating 2  Moving slowly or fidgety/restless 0  Suicidal thoughts 0  PHQ-9 Score 18  Difficult doing work/chores Somewhat difficult    ALLERGIES: No Known Allergies  MEDICATIONS: Current Outpatient Medications on File Prior to Visit  Medication Sig Dispense Refill  . cetirizine (ZYRTEC) 10 MG tablet Take 10 mg by mouth daily as needed.    . norethindrone-ethinyl estradiol (LOESTRIN 1/20, 21,) 1-20 MG-MCG tablet Take 1 tablet by mouth daily. 1 Package 11   No current facility-administered medications on file prior to visit.     PAST MEDICAL HISTORY: Past Medical History:  Diagnosis Date  . Allergy   . Cutaneous lupus erythematosus   . Dermatitis   . Obesity   . Perennial allergic rhinitis   . Psoriasis  PAST SURGICAL HISTORY: Past Surgical History:  Procedure Laterality Date  . ABDOMINOPLASTY  2015  . APPENDECTOMY  2011    SOCIAL HISTORY: Social History   Tobacco Use  . Smoking status: Never Smoker  . Smokeless tobacco: Never Used  Substance Use Topics  . Alcohol use: No    Alcohol/week: 0.0 oz    Comment: very rare  . Drug use: No    FAMILY HISTORY: Family History  Problem Relation Age of Onset  .  High blood pressure Mother   . Heart disease Mother   . Stroke Mother   . Hypertension Father   . Stroke Father   . Prostate cancer Father 31       prostate- currently in remission  . High Cholesterol Father   . Allergies Daughter   . Allergies Son   . Pancreatic cancer Maternal Grandmother 29  . Colon cancer Maternal Grandfather 9  . Prostate cancer Paternal Grandfather 62    ROS: Review of Systems  Constitutional: Positive for malaise/Aimee.  Cardiovascular: Negative for chest pain and orthopnea.  Gastrointestinal: Negative for nausea and vomiting.  Musculoskeletal:       Negative for muscle weakness  Skin: Positive for rash.  Neurological: Positive for headaches.    PHYSICAL EXAM: Blood pressure 136/86, pulse 75, temperature 98 F (36.7 C), temperature source Oral, height 5\' 7"  (1.702 m), weight 228 lb (103.4 kg), last menstrual period 03/25/2017, SpO2 100 %. Body mass index is 35.71 kg/m. Physical Exam  Constitutional: She is oriented to person, place, and time. She appears well-developed and well-nourished.  HENT:  Head: Normocephalic and atraumatic.  Nose: Nose normal.  Eyes: EOM are normal. No scleral icterus.  Neck: Normal range of motion. Neck supple. No thyromegaly present.  Cardiovascular: Normal rate.  Murmur heard.  Systolic (early) murmur is present with a grade of 1/6. Pulmonary/Chest: Effort normal. No respiratory distress.  Abdominal: Soft. There is no tenderness.  +obesity  Musculoskeletal: Normal range of motion.  Range of Motion normal in all 4 extremities  Neurological: She is alert and oriented to person, place, and time. Coordination normal.  Skin: Skin is warm and dry.  Psychiatric: She has a normal mood and affect. Her behavior is normal.  Vitals reviewed.   RECENT LABS AND TESTS: BMET    Component Value Date/Time   NA 137 08/15/2016 0852   K 5.2 08/15/2016 0852   CL 100 08/15/2016 0852   CO2 23 08/15/2016 0852   GLUCOSE 82  08/15/2016 0852   BUN 13 08/15/2016 0852   CREATININE 1.04 (H) 08/15/2016 0852   CALCIUM 9.1 08/15/2016 0852   GFRNONAA 68 08/15/2016 0852   GFRAA 78 08/15/2016 0852   No results found for: HGBA1C No results found for: INSULIN CBC    Component Value Date/Time   WBC 5.4 08/15/2016 0852   RBC 4.59 08/15/2016 0852   HGB 13.4 08/15/2016 0852   HCT 40.5 08/15/2016 0852   PLT 297 08/15/2016 0852   MCV 88 08/15/2016 0852   MCH 29.2 08/15/2016 0852   MCHC 33.1 08/15/2016 0852   RDW 13.4 08/15/2016 0852   LYMPHSABS 2.0 02/01/2015 1149   EOSABS 0.1 02/01/2015 1149   BASOSABS 0.0 02/01/2015 1149   Iron/TIBC/Ferritin/ %Sat No results found for: IRON, TIBC, FERRITIN, IRONPCTSAT Lipid Panel     Component Value Date/Time   CHOL 174 08/15/2016 0852   TRIG 66 08/15/2016 0852   HDL 52 08/15/2016 0852   CHOLHDL 3.3 08/15/2016 0852   LDLCALC  109 (H) 08/15/2016 0852   Hepatic Function Panel     Component Value Date/Time   PROT 7.0 08/15/2016 0852   ALBUMIN 4.2 08/15/2016 0852   AST 16 08/15/2016 0852   ALT 12 08/15/2016 0852   ALKPHOS 82 08/15/2016 0852   BILITOT 0.5 08/15/2016 0852   BILIDIR 0.13 08/15/2016 0852      Component Value Date/Time   TSH 2.770 08/15/2016 0852   Vitamin D   Ref. Range 08/15/2016 08:52  Vitamin D, 25-Hydroxy Latest Ref Range: 30.0 - 100.0 ng/mL 21.0 (L)    ECG  shows NSR with a rate of 75 BPM INDIRECT CALORIMETER done today shows a VO2 of 258 and a REE of 1795. Her calculated basal metabolic rate is 2297 thus her basal metabolic rate is better than expected.    ASSESSMENT AND PLAN: Other Aimee - Plan: EKG 12-Lead, CBC With Differential, Vitamin B12, Comprehensive metabolic panel, Folate, Hemoglobin A1c, Insulin, random, Lipid Panel With LDL/HDL Ratio, T3, T4, free, TSH  Shortness of breath on exertion - Plan: CBC With Differential  Vitamin D deficiency - Plan: VITAMIN D 25 Hydroxy (Vit-D Deficiency, Fractures)  Depression screening  At  risk for heart disease  Class 2 severe obesity with serious comorbidity and body mass index (BMI) of 35.0 to 35.9 in adult, unspecified obesity type (HCC)  PLAN:  Aimee Aimee Medina was informed that her Aimee may be related to obesity, depression or many other causes. Labs will be ordered, and in the meanwhile Aimee Medina has agreed to work on diet, exercise and weight loss to help with Aimee. Proper sleep hygiene was discussed including the need for 7-8 hours of quality sleep each night. A sleep study was not ordered based on symptoms and Epworth score.  Dyspnea on exertion Aimee Medina's shortness of breath appears to be obesity related and exercise induced. She has agreed to work on weight loss and gradually increase exercise to treat her exercise induced shortness of breath. If Aimee Medina follows our instructions and loses weight without improvement of her shortness of breath, we will plan to refer to pulmonology. We will monitor this condition regularly. Aimee Medina agrees to this plan.  Vitamin D Deficiency Aimee Medina was informed that low vitamin D levels contributes to Aimee and are associated with obesity, breast, and colon cancer. We will check labs and she will follow up for routine testing of vitamin D, at least 2-3 times per year. Aimee Medina agrees to follow up with our clinic in 2 weeks.  Cardiovascular risk counseling Aimee Medina was given extended (15 minutes) coronary artery disease prevention counseling today. She is 42 y.o. female and has risk factors for heart disease including obesity. We discussed intensive lifestyle modifications today with an emphasis on specific weight loss instructions and strategies. Pt was also informed of the importance of increasing exercise and decreasing saturated fats to help prevent heart disease.  Depression Screen Aimee Medina had a strongly positive depression screening. Depression is commonly associated with obesity and often results in emotional eating behaviors. We will monitor this closely  and work on CBT to help improve the non-hunger eating patterns. Referral to Psychology may be required if no improvement is seen as she continues in our clinic.  Obesity Aimee Medina is currently in the action stage of change and her goal is to continue with weight loss efforts She has agreed to follow the Category 2 plan +100 calories Aimee Medina has been instructed to work up to a goal of 150 minutes of combined cardio and strengthening exercise per week  for weight loss and overall health benefits. We discussed the following Behavioral Modification Strategies today: increasing lean protein intake, decreasing simple carbohydrates  and work on meal planning and easy cooking plans  Aimee Medina has agreed to follow up with our clinic in 2 weeks. She was informed of the importance of frequent follow up visits to maximize her success with intensive lifestyle modifications for her multiple health conditions. She was informed we would discuss her lab results at her next visit unless there is a critical issue that needs to be addressed sooner. Horace agreed to keep her next visit at the agreed upon time to discuss these results.    OBESITY BEHAVIORAL INTERVENTION VISIT  Today's visit was # 1 out of 22.  Starting weight: 228 lbs Starting date: 03/27/17 Today's weight : 228 lbs Today's date: 03/27/2017 Total lbs lost to date: 0 (Patients must lose 7 lbs in the first 6 months to continue with counseling)   ASK: We discussed the diagnosis of obesity with Andria Frames [place] today and Arieal agreed to give [place] permission to discuss obesity behavioral modification therapy today.  ASSESS: Vinie has the diagnosis of obesity and her BMI today is 35.7 Alayzia is in the action stage of change   ADVISE: Rhylen was educated on the multiple health risks of obesity as well as the benefit of weight loss to improve her health. She was advised of the need for long term treatment and the importance of lifestyle modifications.  AGREE: Multiple  dietary modification options and treatment options were discussed and  Tempie agreed to the above obesity treatment plan.   I, Doreene Nest, am acting as transcriptionist for Dennard Nip, MD   I have reviewed the above documentation for accuracy and completeness, and I agree with the above. -Dennard Nip, MD

## 2017-03-28 LAB — COMPREHENSIVE METABOLIC PANEL
A/G RATIO: 1.4 (ref 1.2–2.2)
ALBUMIN: 3.9 g/dL (ref 3.5–5.5)
ALT: 16 IU/L (ref 0–32)
AST: 18 IU/L (ref 0–40)
Alkaline Phosphatase: 78 IU/L (ref 39–117)
BILIRUBIN TOTAL: 0.3 mg/dL (ref 0.0–1.2)
BUN / CREAT RATIO: 12 (ref 9–23)
BUN: 10 mg/dL (ref 6–24)
CHLORIDE: 103 mmol/L (ref 96–106)
CO2: 19 mmol/L — ABNORMAL LOW (ref 20–29)
Calcium: 8.6 mg/dL — ABNORMAL LOW (ref 8.7–10.2)
Creatinine, Ser: 0.84 mg/dL (ref 0.57–1.00)
GFR calc non Af Amer: 87 mL/min/{1.73_m2} (ref >59)
GFR, EST AFRICAN AMERICAN: 101 mL/min/{1.73_m2} (ref >59)
Globulin, Total: 2.7 g/dL (ref 1.5–4.5)
Glucose: 76 mg/dL (ref 65–99)
Potassium: 3.9 mmol/L (ref 3.5–5.2)
Sodium: 138 mmol/L (ref 134–144)
Total Protein: 6.6 g/dL (ref 6.0–8.5)

## 2017-03-28 LAB — LIPID PANEL WITH LDL/HDL RATIO
CHOLESTEROL TOTAL: 175 mg/dL (ref 100–199)
HDL: 53 mg/dL (ref >39)
LDL CALC: 107 mg/dL — AB (ref 0–99)
LDl/HDL Ratio: 2 ratio (ref 0.0–3.2)
Triglycerides: 73 mg/dL (ref 0–149)
VLDL CHOLESTEROL CAL: 15 mg/dL (ref 5–40)

## 2017-03-28 LAB — CBC WITH DIFFERENTIAL
BASOS ABS: 0 10*3/uL (ref 0.0–0.2)
BASOS: 0 %
EOS (ABSOLUTE): 0.1 10*3/uL (ref 0.0–0.4)
Eos: 1 %
Hematocrit: 40.6 % (ref 34.0–46.6)
Hemoglobin: 13.7 g/dL (ref 11.1–15.9)
Immature Grans (Abs): 0 10*3/uL (ref 0.0–0.1)
Immature Granulocytes: 0 %
Lymphocytes Absolute: 1.9 10*3/uL (ref 0.7–3.1)
Lymphs: 29 %
MCH: 30 pg (ref 26.6–33.0)
MCHC: 33.7 g/dL (ref 31.5–35.7)
MCV: 89 fL (ref 79–97)
MONOS ABS: 0.4 10*3/uL (ref 0.1–0.9)
Monocytes: 6 %
NEUTROS ABS: 4.1 10*3/uL (ref 1.4–7.0)
NEUTROS PCT: 64 %
RBC: 4.56 x10E6/uL (ref 3.77–5.28)
RDW: 14.3 % (ref 12.3–15.4)
WBC: 6.4 10*3/uL (ref 3.4–10.8)

## 2017-03-28 LAB — T4, FREE: Free T4: 1.29 ng/dL (ref 0.82–1.77)

## 2017-03-28 LAB — TSH: TSH: 1.8 u[IU]/mL (ref 0.450–4.500)

## 2017-03-28 LAB — FOLATE: Folate: 12.8 ng/mL (ref >3.0)

## 2017-03-28 LAB — VITAMIN B12: Vitamin B-12: 695 pg/mL (ref 232–1245)

## 2017-03-28 LAB — HEMOGLOBIN A1C

## 2017-03-28 LAB — T3: T3, Total: 101 ng/dL (ref 71–180)

## 2017-03-28 LAB — INSULIN, RANDOM: INSULIN: 9.4 u[IU]/mL (ref 2.6–24.9)

## 2017-03-28 LAB — VITAMIN D 25 HYDROXY (VIT D DEFICIENCY, FRACTURES): Vit D, 25-Hydroxy: 15.7 ng/mL — ABNORMAL LOW (ref 30.0–100.0)

## 2017-04-02 ENCOUNTER — Ambulatory Visit (INDEPENDENT_AMBULATORY_CARE_PROVIDER_SITE_OTHER): Payer: Self-pay | Admitting: Family Medicine

## 2017-04-15 ENCOUNTER — Ambulatory Visit (INDEPENDENT_AMBULATORY_CARE_PROVIDER_SITE_OTHER): Payer: No Typology Code available for payment source | Admitting: Family Medicine

## 2017-04-15 VITALS — BP 126/87 | HR 85 | Temp 98.2°F | Ht 67.0 in | Wt 225.0 lb

## 2017-04-15 DIAGNOSIS — E88819 Insulin resistance, unspecified: Secondary | ICD-10-CM

## 2017-04-15 DIAGNOSIS — E559 Vitamin D deficiency, unspecified: Secondary | ICD-10-CM

## 2017-04-15 DIAGNOSIS — E8881 Metabolic syndrome: Secondary | ICD-10-CM | POA: Diagnosis not present

## 2017-04-15 DIAGNOSIS — Z6835 Body mass index (BMI) 35.0-35.9, adult: Secondary | ICD-10-CM | POA: Diagnosis not present

## 2017-04-15 DIAGNOSIS — Z9189 Other specified personal risk factors, not elsewhere classified: Secondary | ICD-10-CM | POA: Diagnosis not present

## 2017-04-15 DIAGNOSIS — E66812 Obesity, class 2: Secondary | ICD-10-CM

## 2017-04-15 MED ORDER — VITAMIN D (ERGOCALCIFEROL) 1.25 MG (50000 UNIT) PO CAPS: 50000.0000 [IU] | ORAL_CAPSULE | ORAL | 0 refills | Status: DC

## 2017-04-15 NOTE — Progress Notes (Signed)
Office: [phone]  /  Fax: [phone]   HPI:   Chief Complaint: OBESITY Aimee Medina is here to discuss her progress with her obesity treatment plan. She is on the  follow the Category 2 plan +100 calories and is following her eating plan approximately 80 % of the time. She states she is walking for 30 minutes 3 times per week. [name] has done well with weight loss. She has followed her plan better during the week and she struggled over the weekends with various kids activities.Hunger was mostly controlled. Her weight is 225 lb (102.1 kg) today and has had a weight loss of 3 pounds over a period of 2 weeks since her last visit. She has lost 3 lbs since starting treatment with [place].  Vitamin D deficiency [name] has a diagnosis of vitamin D deficiency. [name] is not on vit D and she admits fatigue. She had been on vit D prescription weekly, but she never reached vit D goal. Blaze denies nausea, vomiting or muscle weakness.  Insulin Resistance (new) [name] has a new diagnosis of insulin resistance. She has normal glucose, but elevated fasting insulin level >5. Hgb A1c was unable to be obtained due to lab error.Although [name]'s blood glucose readings are still under good control, insulin resistance puts her at greater risk of metabolic syndrome and diabetes. She is not taking metformin currently and continues to work on diet and exercise to decrease risk of diabetes.  At risk for diabetes [name] is at higher than average risk for developing diabetes due to her obesity and insulin resistance. She currently denies polyuria or polydipsia.  ALLERGIES: No Known Allergies  MEDICATIONS: Current Outpatient Medications on File Prior to Visit  Medication Sig Dispense Refill  . cetirizine (ZYRTEC) 10 MG tablet Take 10 mg by mouth daily as needed.    . norethindrone-ethinyl estradiol (LOESTRIN 1/20, 21,) 1-20 MG-MCG tablet Take 1 tablet by mouth daily. 1 Package 11   No current facility-administered medications on  file prior to visit.     PAST MEDICAL HISTORY: Past Medical History:  Diagnosis Date  . Allergy   . Cutaneous lupus erythematosus   . Dermatitis   . Obesity   . Perennial allergic rhinitis   . Psoriasis     PAST SURGICAL HISTORY: Past Surgical History:  Procedure Laterality Date  . ABDOMINOPLASTY  2015  . APPENDECTOMY  2011    SOCIAL HISTORY: Social History   Tobacco Use  . Smoking status: Never Smoker  . Smokeless tobacco: Never Used  Substance Use Topics  . Alcohol use: No    Alcohol/week: 0.0 oz    Comment: very rare  . Drug use: No    FAMILY HISTORY: Family History  Problem Relation Age of Onset  . High blood pressure Mother   . Heart disease Mother   . Stroke Mother   . Hypertension Father   . Stroke Father   . Prostate cancer Father 40       prostate- currently in remission  . High Cholesterol Father   . Allergies Daughter   . Allergies Son   . Pancreatic cancer Maternal Grandmother 15  . Colon cancer Maternal Grandfather 62  . Prostate cancer Paternal Grandfather 70    ROS: Review of Systems  Constitutional: Positive for malaise/fatigue and weight loss.  Gastrointestinal: Negative for nausea and vomiting.  Genitourinary: Negative for frequency.  Musculoskeletal:       Negative for muscle weakness  Endo/Heme/Allergies: Negative for polydipsia.    PHYSICAL EXAM: Blood  pressure 126/87, pulse 85, temperature 98.2 F (36.8 C), temperature source Oral, height 5\' 7"  (1.702 m), weight 225 lb (102.1 kg), last menstrual period 03/25/2017, SpO2 98 %. Body mass index is 35.24 kg/m. Physical Exam  Constitutional: She is oriented to person, place, and time. She appears well-developed and well-nourished.  Cardiovascular: Normal rate.  Pulmonary/Chest: Effort normal.  Musculoskeletal: Normal range of motion.  Neurological: She is oriented to person, place, and time.  Skin: Skin is warm and dry.  Psychiatric: She has a normal mood and affect. Her  behavior is normal.  Vitals reviewed.   RECENT LABS AND TESTS: BMET    Component Value Date/Time   NA 138 03/27/2017 1131   K 3.9 03/27/2017 1131   CL 103 03/27/2017 1131   CO2 19 (L) 03/27/2017 1131   GLUCOSE 76 03/27/2017 1131   BUN 10 03/27/2017 1131   CREATININE 0.84 03/27/2017 1131   CALCIUM 8.6 (L) 03/27/2017 1131   GFRNONAA 87 03/27/2017 1131   GFRAA 101 03/27/2017 1131   Lab Results  Component Value Date   HGBA1C CANCELED 03/27/2017   Lab Results  Component Value Date   INSULIN 9.4 03/27/2017   CBC    Component Value Date/Time   WBC 6.4 03/27/2017 1131   RBC 4.56 03/27/2017 1131   HGB 13.7 03/27/2017 1131   HCT 40.6 03/27/2017 1131   PLT 297 08/15/2016 0852   MCV 89 03/27/2017 1131   MCH 30.0 03/27/2017 1131   MCHC 33.7 03/27/2017 1131   RDW 14.3 03/27/2017 1131   LYMPHSABS 1.9 03/27/2017 1131   EOSABS 0.1 03/27/2017 1131   BASOSABS 0.0 03/27/2017 1131   Iron/TIBC/Ferritin/ %Sat No results found for: IRON, TIBC, FERRITIN, IRONPCTSAT Lipid Panel     Component Value Date/Time   CHOL 175 03/27/2017 1131   TRIG 73 03/27/2017 1131   HDL 53 03/27/2017 1131   CHOLHDL 3.3 08/15/2016 0852   LDLCALC 107 (H) 03/27/2017 1131   Hepatic Function Panel     Component Value Date/Time   PROT 6.6 03/27/2017 1131   ALBUMIN 3.9 03/27/2017 1131   AST 18 03/27/2017 1131   ALT 16 03/27/2017 1131   ALKPHOS 78 03/27/2017 1131   BILITOT 0.3 03/27/2017 1131   BILIDIR 0.13 08/15/2016 0852      Component Value Date/Time   TSH 1.800 03/27/2017 1131   TSH 2.770 08/15/2016 0852   TSH 2.230 02/01/2015 1149   Results for Harb, Salina R (MRN [id number]) as of 04/15/2017 15:59  Ref. Range 03/27/2017 11:31  Vitamin D, 25-Hydroxy Latest Ref Range: 30.0 - 100.0 ng/mL 15.7 (L)   ASSESSMENT AND PLAN: Vitamin D deficiency - Plan: Vitamin D, Ergocalciferol, (DRISDOL) 50000 units CAPS capsule  Insulin resistance  At risk for diabetes mellitus  Class 2 severe obesity with  serious comorbidity and body mass index (BMI) of 35.0 to 35.9 in adult, unspecified obesity type ([place])  PLAN:  Vitamin D Deficiency [name] was informed that low vitamin D levels contributes to fatigue and are associated with obesity, breast, and colon cancer. She agrees to continue to take prescription Vit D @50 ,000 IU every week and will follow up for routine testing of vitamin D, at least 2-3 times per year. She was informed of the risk of over-replacement of vitamin D and agrees to not increase her dose unless she discusses this with [place] first.  Insulin Resistance Batsheva will continue to work on weight loss, exercise, and decreasing simple carbohydrates in her diet to help decrease the risk  of diabetes. She was informed that eating too many simple carbohydrates or too many calories at one sitting increases the likelihood of GI side effects.  We will defer metformin and will recheck labs in 3 months. Rivka agreed to follow up with [place] as directed to monitor her progress.  Diabetes risk counseling Isela was given extended (30 minutes) diabetes prevention counseling today. She is 42 y.o. female and has risk factors for diabetes including obesity and insulin resistance. We discussed intensive lifestyle modifications today with an emphasis on weight loss as well as increasing exercise and decreasing simple carbohydrates in her diet.  Obesity Sundra is currently in the action stage of change. As such, her goal is to continue with weight loss efforts She has agreed to follow the Category 2 plan +100 calories Shalee has been instructed to work up to a goal of 150 minutes of combined cardio and strengthening exercise per week for weight loss and overall health benefits. We discussed the following Behavioral Modification Strategies today: increasing lean protein intake, work on meal planning and easy cooking plans, dealing with family or coworker sabotage and travel eating strategies   Abrianna has agreed to follow up  with our clinic in 2 weeks. She was informed of the importance of frequent follow up visits to maximize her success with intensive lifestyle modifications for her multiple health conditions.   OBESITY BEHAVIORAL INTERVENTION VISIT  Today's visit was # 2 out of 22.  Starting weight: 228 lbs Starting date: 03/27/17 Today's weight : 225 lbs Today's date: 04/15/2017 Total lbs lost to date: 3 (Patients must lose 7 lbs in the first 6 months to continue with counseling)   ASK: We discussed the diagnosis of obesity with Andria Frames [place] today and Hallelujah agreed to give [place] permission to discuss obesity behavioral modification therapy today.  ASSESS: Elenore has the diagnosis of obesity and her BMI today is 35.23 Malaiyah is in the action stage of change   ADVISE: Breean was educated on the multiple health risks of obesity as well as the benefit of weight loss to improve her health. She was advised of the need for long term treatment and the importance of lifestyle modifications.  AGREE: Multiple dietary modification options and treatment options were discussed and  Maral agreed to the above obesity treatment plan.  I, Doreene Nest, am acting as transcriptionist for Dennard Nip, MD  I have reviewed the above documentation for accuracy and completeness, and I agree with the above. -Dennard Nip, MD

## 2017-04-17 ENCOUNTER — Ambulatory Visit (INDEPENDENT_AMBULATORY_CARE_PROVIDER_SITE_OTHER): Payer: No Typology Code available for payment source | Admitting: Family Medicine

## 2017-04-29 ENCOUNTER — Ambulatory Visit (INDEPENDENT_AMBULATORY_CARE_PROVIDER_SITE_OTHER): Payer: No Typology Code available for payment source | Admitting: Family Medicine

## 2017-04-29 VITALS — BP 117/80 | HR 77 | Temp 98.4°F | Ht 67.0 in | Wt 223.0 lb

## 2017-04-29 DIAGNOSIS — Z9189 Other specified personal risk factors, not elsewhere classified: Secondary | ICD-10-CM | POA: Diagnosis not present

## 2017-04-29 DIAGNOSIS — E559 Vitamin D deficiency, unspecified: Secondary | ICD-10-CM | POA: Diagnosis not present

## 2017-04-29 DIAGNOSIS — Z6835 Body mass index (BMI) 35.0-35.9, adult: Secondary | ICD-10-CM | POA: Diagnosis not present

## 2017-04-29 NOTE — Progress Notes (Signed)
Office: [phone]  /  Fax: [phone]   HPI:   Chief Complaint: OBESITY Aimee Medina is here to discuss her progress with her obesity treatment plan. She is on the Category 2 plan + 100 calories and is following her eating plan approximately 70 % of the time. She states she is walking for 30 minutes 3 times per week. Aimee Medina continues to do well with weight loss, she does well with Category 2 plan during the week but struggles over the weekend. She does especially well with meal planning.  Her weight is 223 lb (101.2 kg) today and has had a weight loss of 2 pounds over a period of 2 weeks since her last visit. She has lost 5 lbs since starting treatment with [place].  Vitamin D Deficiency Aimee Medina has a diagnosis of vitamin D deficiency. She is on Vit D prescription 2 times per week and denies nausea, vomiting or muscle weakness. Last level was low and she notes fatigue.  At risk for osteopenia and osteoporosis Aimee Medina is at higher risk of osteopenia and osteoporosis due to vitamin D deficiency.   ALLERGIES: No Known Allergies  MEDICATIONS: Current Outpatient Medications on File Prior to Visit  Medication Sig Dispense Refill  . cetirizine (ZYRTEC) 10 MG tablet Take 10 mg by mouth daily as needed.    . norethindrone-ethinyl estradiol (LOESTRIN 1/20, 21,) 1-20 MG-MCG tablet Take 1 tablet by mouth daily. 1 Package 11  . Vitamin D, Ergocalciferol, (DRISDOL) 50000 units CAPS capsule Take 1 capsule (50,000 Units total) by mouth every 3 (three) days. 10 capsule 0   No current facility-administered medications on file prior to visit.     PAST MEDICAL HISTORY: Past Medical History:  Diagnosis Date  . Allergy   . Cutaneous lupus erythematosus   . Dermatitis   . Obesity   . Perennial allergic rhinitis   . Psoriasis     PAST SURGICAL HISTORY: Past Surgical History:  Procedure Laterality Date  . ABDOMINOPLASTY  2015  . APPENDECTOMY  2011    SOCIAL HISTORY: Social History   Tobacco Use  .  Smoking status: Never Smoker  . Smokeless tobacco: Never Used  Substance Use Topics  . Alcohol use: No    Alcohol/week: 0.0 oz    Comment: very rare  . Drug use: No    FAMILY HISTORY: Family History  Problem Relation Age of Onset  . High blood pressure Mother   . Heart disease Mother   . Stroke Mother   . Hypertension Father   . Stroke Father   . Prostate cancer Father 78       prostate- currently in remission  . High Cholesterol Father   . Allergies Daughter   . Allergies Son   . Pancreatic cancer Maternal Grandmother 11  . Colon cancer Maternal Grandfather 62  . Prostate cancer Paternal Grandfather 52    ROS: Review of Systems  Constitutional: Positive for malaise/fatigue and weight loss.  Gastrointestinal: Negative for nausea and vomiting.  Musculoskeletal:       Negative muscle weakness    PHYSICAL EXAM: Blood pressure 117/80, pulse 77, temperature 98.4 F (36.9 C), temperature source Oral, height 5\' 7"  (1.702 m), weight 223 lb (101.2 kg), SpO2 98 %. Body mass index is 34.93 kg/m. Physical Exam  Constitutional: She is oriented to person, place, and time. She appears well-developed and well-nourished.  Cardiovascular: Normal rate.  Pulmonary/Chest: Effort normal.  Musculoskeletal: Normal range of motion.  Neurological: She is oriented to person, place, and time.  Skin: Skin is warm and dry.  Psychiatric: She has a normal mood and affect. Her behavior is normal.  Vitals reviewed.   RECENT LABS AND TESTS: BMET    Component Value Date/Time   NA 138 03/27/2017 1131   K 3.9 03/27/2017 1131   CL 103 03/27/2017 1131   CO2 19 (L) 03/27/2017 1131   GLUCOSE 76 03/27/2017 1131   BUN 10 03/27/2017 1131   CREATININE 0.84 03/27/2017 1131   CALCIUM 8.6 (L) 03/27/2017 1131   GFRNONAA 87 03/27/2017 1131   GFRAA 101 03/27/2017 1131   Lab Results  Component Value Date   HGBA1C CANCELED 03/27/2017   Lab Results  Component Value Date   INSULIN 9.4 03/27/2017    CBC    Component Value Date/Time   WBC 6.4 03/27/2017 1131   RBC 4.56 03/27/2017 1131   HGB 13.7 03/27/2017 1131   HCT 40.6 03/27/2017 1131   PLT 297 08/15/2016 0852   MCV 89 03/27/2017 1131   MCH 30.0 03/27/2017 1131   MCHC 33.7 03/27/2017 1131   RDW 14.3 03/27/2017 1131   LYMPHSABS 1.9 03/27/2017 1131   EOSABS 0.1 03/27/2017 1131   BASOSABS 0.0 03/27/2017 1131   Iron/TIBC/Ferritin/ %Sat No results found for: IRON, TIBC, FERRITIN, IRONPCTSAT Lipid Panel     Component Value Date/Time   CHOL 175 03/27/2017 1131   TRIG 73 03/27/2017 1131   HDL 53 03/27/2017 1131   CHOLHDL 3.3 08/15/2016 0852   LDLCALC 107 (H) 03/27/2017 1131   Hepatic Function Panel     Component Value Date/Time   PROT 6.6 03/27/2017 1131   ALBUMIN 3.9 03/27/2017 1131   AST 18 03/27/2017 1131   ALT 16 03/27/2017 1131   ALKPHOS 78 03/27/2017 1131   BILITOT 0.3 03/27/2017 1131   BILIDIR 0.13 08/15/2016 0852      Component Value Date/Time   TSH 1.800 03/27/2017 1131   TSH 2.770 08/15/2016 0852   TSH 2.230 02/01/2015 1149  Results for Medina, Aimee R (MRN [id number]) as of 04/29/2017 08:10  Ref. Range 03/27/2017 11:31  Vitamin D, 25-Hydroxy Latest Ref Range: 30.0 - 100.0 ng/mL 15.7 (L)    ASSESSMENT AND PLAN: Vitamin D deficiency  At risk for osteoporosis  Class 2 severe obesity with serious comorbidity and body mass index (BMI) of 35.0 to 35.9 in adult, unspecified obesity type (Aimee Medina)  PLAN:  Vitamin D Deficiency Aimee Medina was informed that low vitamin D levels contributes to fatigue and are associated with obesity, breast, and colon cancer. Aimee Medina agrees to continue taking prescription Vit D @50 ,000 IU 2 times every week and will follow up for routine testing of vitamin D, at least 2-3 times per year. She was informed of the risk of over-replacement of vitamin D and agrees to not increase her dose unless she discusses this with [place] first. We will recheck labs in 6 weeks and Aimee Medina agrees to follow up with our  clinic in 2 to 3 weeks.  At risk for osteopenia and osteoporosis Aimee Medina is at risk for osteopenia and osteoporsis due to her vitamin D deficiency. She was encouraged to take her vitamin D and follow her higher calcium diet and increase strengthening exercise to help strengthen her bones and decrease her risk of osteopenia and osteoporosis.  Obesity Aimee Medina is currently in the action stage of change. As such, her goal is to continue with weight loss efforts She has agreed to keep a food journal with 250-350 calories and 20 grams of protein at breakfast daily  and follow the Category 2 plan + 100 calories Aimee Medina has been instructed to work up to a goal of 150 minutes of combined cardio and strengthening exercise per week for weight loss and overall health benefits. We discussed the following Behavioral Modification Strategies today: increasing lean protein intake, travel eating strategies, and holiday eating strategies    Aimee Medina has agreed to follow up with our clinic in 2 to 3 weeks. She was informed of the importance of frequent follow up visits to maximize her success with intensive lifestyle modifications for her multiple health conditions.   OBESITY BEHAVIORAL INTERVENTION VISIT  Today's visit was # 3 out of 22.  Starting weight: 228 lbs Starting date: 03/27/17 Today's weight : 223 lbs Today's date: 04/29/2017 Total lbs lost to date: 5 (Patients must lose 7 lbs in the first 6 months to continue with counseling)   ASK: We discussed the diagnosis of obesity with Aimee Medina today and Aimee Medina agreed to give [place] permission to discuss obesity behavioral modification therapy today.  ASSESS: Aimee Medina has the diagnosis of obesity and her BMI today is 34.92 Aimee Medina is in the action stage of change   ADVISE: Aimee Medina was educated on the multiple health risks of obesity as well as the benefit of weight loss to improve her health. She was advised of the need for long term treatment and the importance of lifestyle  modifications.  AGREE: Multiple dietary modification options and treatment options were discussed and  Aimee Medina agreed to the above obesity treatment plan.  I, Aimee Medina, am acting as transcriptionist for Aimee Nip, MD  I have reviewed the above documentation for accuracy and completeness, and I agree with the above. -Aimee Nip, MD

## 2017-05-20 ENCOUNTER — Ambulatory Visit (INDEPENDENT_AMBULATORY_CARE_PROVIDER_SITE_OTHER): Payer: No Typology Code available for payment source | Admitting: Family Medicine

## 2017-05-20 ENCOUNTER — Encounter (INDEPENDENT_AMBULATORY_CARE_PROVIDER_SITE_OTHER): Payer: Self-pay

## 2017-05-20 ENCOUNTER — Encounter (INDEPENDENT_AMBULATORY_CARE_PROVIDER_SITE_OTHER): Payer: Self-pay | Admitting: Family Medicine

## 2017-05-20 NOTE — Telephone Encounter (Signed)
Please R/S

## 2017-05-23 ENCOUNTER — Ambulatory Visit (INDEPENDENT_AMBULATORY_CARE_PROVIDER_SITE_OTHER): Payer: Self-pay | Admitting: Family Medicine

## 2017-05-23 ENCOUNTER — Encounter: Payer: Self-pay | Admitting: Family Medicine

## 2017-05-23 VITALS — BP 130/90 | HR 73 | Temp 97.2°F | Wt 229.0 lb

## 2017-05-23 DIAGNOSIS — R0982 Postnasal drip: Secondary | ICD-10-CM

## 2017-05-23 DIAGNOSIS — J029 Acute pharyngitis, unspecified: Secondary | ICD-10-CM

## 2017-05-23 DIAGNOSIS — H6983 Other specified disorders of Eustachian tube, bilateral: Secondary | ICD-10-CM

## 2017-05-23 MED ORDER — LEVOCETIRIZINE DIHYDROCHLORIDE 5 MG PO TABS: 5.0000 mg | ORAL_TABLET | Freq: Every evening | ORAL | 1 refills | Status: DC

## 2017-05-23 MED ORDER — IPRATROPIUM BROMIDE 0.03 % NA SOLN: 2.0000 | Freq: Two times a day (BID) | NASAL | 0 refills | Status: DC

## 2017-05-23 NOTE — Patient Instructions (Signed)

## 2017-05-23 NOTE — Progress Notes (Signed)
Patient ID: Aimee Medina, female    DOB: [birth year], 42 y.o.   MRN: [id number]  PCP: Mikey College, NP  Chief Complaint  Patient presents with  . focus sorethroat/ear pain    Subjective:  HPI Aimee Medina is a 42 y.o. female presents for evaluation of sore throat and ear pain. Current symptoms have been present for 4 days. Reports a history of allergies for which she chronically takes Zyrtec which have not improved current symptoms. Reports bilateral ear pressure and the sensation of "fluid" draining in the ear. She also complain of soreness of throat which progresses as the day goes on. Throat soreness is exacerbated by drinking and eating. Throat pain improves with lozenges. Problem has remained unchanged over the course of the last 4 days. Denies fever, shortness of breath, wheezing, coughing, rhinorrhea, sinus congestion, or sinus pressure.  Social History   Socioeconomic History  . Marital status: Married    Spouse name: Aimee Medina  . Number of children: 2  . Years of education: bachelors  . Highest education level: Not on file  Occupational History  . Occupation: Acupuncturist  Social Needs  . Financial resource strain: Not hard at all  . Food insecurity:    Worry: Never true    Inability: Never true  . Transportation needs:    Medical: No    Non-medical: No  Tobacco Use  . Smoking status: Never Smoker  . Smokeless tobacco: Never Used  Substance and Sexual Activity  . Alcohol use: No    Alcohol/week: 0.0 oz    Comment: very rare  . Drug use: No  . Sexual activity: Yes    Partners: Male  Lifestyle  . Physical activity:    Days per week: 1 day    Minutes per session: 40 min  . Stress: Only a little  Relationships  . Social connections:    Talks on phone: More than three times a week    Gets together: Three times a week    Attends religious service: More than 4 times per year    Active member of club or organization: Yes    Attends meetings of clubs or  organizations: More than 4 times per year    Relationship status: Married  . Intimate partner violence:    Fear of current or ex partner: No    Emotionally abused: No    Physically abused: No    Forced sexual activity: No  Other Topics Concern  . Not on file  Social History Narrative   Married.   2 children.   Enjoys relaxing, pedicures, spending time with family.     Family History  Problem Relation Age of Onset  . High blood pressure Mother   . Heart disease Mother   . Stroke Mother   . Hypertension Father   . Stroke Father   . Prostate cancer Father 72       prostate- currently in remission  . High Cholesterol Father   . Allergies Daughter   . Allergies Son   . Pancreatic cancer Maternal Grandmother 4  . Colon cancer Maternal Grandfather 45  . Prostate cancer Paternal Grandfather 9   Review of Systems Pertinent negatives listed in HPI  Patient Active Problem List   Diagnosis Date Noted  . Shortness of breath on exertion 03/27/2017  . Vitamin D deficiency 03/27/2017  . Seasonal and perennial allergic rhinitis 01/21/2015  . Obesity, Class II, BMI 35-39.9, isolated 01/21/2015  . Annual physical exam  01/21/2015  . Other fatigue 01/21/2015  . Cutaneous lupus erythematosus 01/21/2015   No Known Allergies  Prior to Admission medications   Medication Sig Start Date End Date Taking? Authorizing Provider  cetirizine (ZYRTEC) 10 MG tablet Take 10 mg by mouth daily as needed.   Yes [provider]  HALOG 0.1 % OINT APPLY TOPICALLY TO THE AFFECTED AREA EVERY DAY 03/08/17  Yes [provider]  norethindrone-ethinyl estradiol (LOESTRIN 1/20, 21,) 1-20 MG-MCG tablet Take 1 tablet by mouth daily. 01/14/17  Yes Mikey College, NP  Vitamin D, Ergocalciferol, (DRISDOL) 50000 units CAPS capsule Take 50,000 Units by mouth every 7 (seven) days.   Yes [provider]    Past Medical, Surgical Family and Social History reviewed and updated.     Objective:   Today's Vitals   05/23/17 1215  BP: 130/90  Pulse: 73  Temp: (!) 97.2 F (36.2 C)  SpO2: 100%  Weight: 229 lb (103.9 kg)    Wt Readings from Last 3 Encounters:  05/23/17 229 lb (103.9 kg)  04/29/17 223 lb (101.2 kg)  04/15/17 225 lb (102.1 kg)   Physical Exam  Constitutional: She is oriented to person, place, and time. She appears well-developed and well-nourished.  HENT:  Head: Normocephalic and atraumatic.  Right Ear: Hearing, tympanic membrane, external ear and ear canal normal.  Left Ear: Hearing, tympanic membrane and external ear normal.  Nose: Mucosal edema present. Right sinus exhibits no maxillary sinus tenderness and no frontal sinus tenderness. Left sinus exhibits no maxillary sinus tenderness and no frontal sinus tenderness.  Mouth/Throat: Posterior oropharyngeal erythema present.  Cardiovascular: Normal rate, regular rhythm and normal heart sounds.  Pulmonary/Chest: Breath sounds normal.  Musculoskeletal: Normal range of motion.  Neurological: She is oriented to person, place, and time.  Psychiatric: She has a normal mood and affect. Her behavior is normal. Judgment and thought content normal.    Assessment & Plan:  1. Eustachian tube dysfunction, bilateral 2. PND (post-nasal drip) 3. Sore throat Suspect cluster of symptoms are related to seasonal and environmental allergies. Currently prescribed chronic cetrizine, however symptoms have remained persistent despite current treatment. Will trial Levocetirizine 5 mg daily and Atrovent nasal spray, 2 sprays twice daily for a minimum of 7-10 days. If no improvement of symptoms, I will consider a short-course of low dose prednisone. Patient may also benefit from a referral to ENT if this problem continues.     If symptoms worsen or do not improve, return for follow-up, follow-up with PCP, or at the emergency department if severity of symptoms warrant a higher level of care.     Carroll Sage. Kenton Kingfisher, MSN,  FNP-C [facility]  [address], [place] [address] [phone]

## 2017-05-27 ENCOUNTER — Telehealth: Payer: Self-pay | Admitting: Emergency Medicine

## 2017-05-27 NOTE — Telephone Encounter (Signed)
Left message follow up call with visit with [facility].

## 2017-05-29 ENCOUNTER — Encounter: Payer: Self-pay | Admitting: Radiology

## 2017-07-25 ENCOUNTER — Encounter: Payer: Self-pay | Admitting: Nurse Practitioner

## 2017-07-25 ENCOUNTER — Ambulatory Visit (INDEPENDENT_AMBULATORY_CARE_PROVIDER_SITE_OTHER): Payer: No Typology Code available for payment source | Admitting: Nurse Practitioner

## 2017-07-25 VITALS — BP 125/79 | HR 70 | Temp 98.3°F | Ht 67.0 in | Wt 229.6 lb

## 2017-07-25 DIAGNOSIS — Z1231 Encounter for screening mammogram for malignant neoplasm of breast: Secondary | ICD-10-CM | POA: Diagnosis not present

## 2017-07-25 DIAGNOSIS — E559 Vitamin D deficiency, unspecified: Secondary | ICD-10-CM | POA: Diagnosis not present

## 2017-07-25 DIAGNOSIS — Z23 Encounter for immunization: Secondary | ICD-10-CM | POA: Diagnosis not present

## 2017-07-25 DIAGNOSIS — Z0000 Encounter for general adult medical examination without abnormal findings: Secondary | ICD-10-CM | POA: Diagnosis not present

## 2017-07-25 DIAGNOSIS — Z1239 Encounter for other screening for malignant neoplasm of breast: Secondary | ICD-10-CM

## 2017-07-25 NOTE — Progress Notes (Signed)
Subjective:    Patient ID: Aimee Medina, female    DOB: Dec 20, 1975, 42 y.o.   MRN: [id number]  Aimee Medina is a 42 y.o. female presenting on 07/25/2017 for Annual Exam   HPI  Annual Physical Exam Patient has been feeling well.  They have no acute concerns today. Sleeps 6-7 hours per night uninterrupted.  HEALTH MAINTENANCE: Weight/BMI: stable, has seen  Physical activity: Walking 20 minutes 3 days per week Diet: Generally healthy (reducing sugars, sweet tea/ increasing protein intake) PAP: up to date Mammogram: due Optometry: regular Dentistry: regular  VACCINES: Tetanus: due Influenza: yearly with work Gardasil: declines - requested more info  Past Medical History:  Diagnosis Date  . Allergy   . Cutaneous lupus erythematosus   . Dermatitis   . Obesity   . Perennial allergic rhinitis   . Psoriasis    Past Surgical History:  Procedure Laterality Date  . ABDOMINOPLASTY  2015  . APPENDECTOMY  2011   Social History   Socioeconomic History  . Marital status: Married    Spouse name: Leone Payor  . Number of children: 2  . Years of education: bachelors  . Highest education level: Not on file  Occupational History  . Occupation: Acupuncturist  Social Needs  . Financial resource strain: Not hard at all  . Food insecurity:    Worry: Never true    Inability: Never true  . Transportation needs:    Medical: No    Non-medical: No  Tobacco Use  . Smoking status: Never Smoker  . Smokeless tobacco: Never Used  Substance and Sexual Activity  . Alcohol use: No    Alcohol/week: 0.0 oz    Comment: very rare  . Drug use: No  . Sexual activity: Yes    Partners: Male    Birth control/protection: Condom  Lifestyle  . Physical activity:    Days per week: 1 day    Minutes per session: 40 min  . Stress: Only a little  Relationships  . Social connections:    Talks on phone: More than three times a week    Gets together: Three times a week    Attends religious  service: More than 4 times per year    Active member of club or organization: Yes    Attends meetings of clubs or organizations: More than 4 times per year    Relationship status: Married  . Intimate partner violence:    Fear of current or ex partner: No    Emotionally abused: No    Physically abused: No    Forced sexual activity: No  Other Topics Concern  . Not on file  Social History Narrative   Married.   2 children.   Enjoys relaxing, pedicures, spending time with family.    Family History  Problem Relation Age of Onset  . High blood pressure Mother   . Heart disease Mother   . Stroke Mother   . Hypertension Father   . Stroke Father   . Prostate cancer Father 39       prostate- currently in remission  . High Cholesterol Father   . Allergies Daughter   . Allergies Son   . Pancreatic cancer Maternal Grandmother 84  . Colon cancer Maternal Grandfather 68  . Prostate cancer Paternal Grandfather 51   Current Outpatient Medications on File Prior to Visit  Medication Sig  . HALOG 0.1 % OINT APPLY TOPICALLY TO THE AFFECTED AREA EVERY DAY   No current  facility-administered medications on file prior to visit.     Review of Systems  Constitutional: Positive for fatigue (occasional, usually due to less sleep previous night). Negative for chills and fever.  HENT: Negative for congestion and sore throat.   Eyes: Negative for pain.  Respiratory: Negative for cough, shortness of breath and wheezing.   Cardiovascular: Negative for chest pain, palpitations and leg swelling.  Gastrointestinal: Negative for abdominal pain, blood in stool, constipation, diarrhea, nausea and vomiting.  Endocrine: Negative for polydipsia.  Genitourinary: Negative for dysuria, frequency, hematuria and urgency.  Musculoskeletal: Negative for back pain, myalgias and neck pain.  Skin: Negative.  Negative for rash.  Allergic/Immunologic: Negative for environmental allergies.  Neurological: Negative for  dizziness, weakness and headaches.  Hematological: Does not bruise/bleed easily.  Psychiatric/Behavioral: Negative for dysphoric mood, sleep disturbance and suicidal ideas. The patient is not nervous/anxious.    Per HPI unless specifically indicated above     Objective:    BP 125/79 (BP Location: Right Arm, Patient Position: Sitting, Cuff Size: Normal)   Pulse 70   Temp 98.3 F (36.8 C) (Oral)   Ht 5\' 7"  (1.702 m)   Wt 229 lb 9.6 oz (104.1 kg)   BMI 35.96 kg/m   Wt Readings from Last 3 Encounters:  07/25/17 229 lb 9.6 oz (104.1 kg)  05/23/17 229 lb (103.9 kg)  04/29/17 223 lb (101.2 kg)    Physical Exam  Constitutional: She is oriented to person, place, and time. She appears well-developed and well-nourished. No distress.  HENT:  Head: Normocephalic and atraumatic.  Right Ear: External ear normal.  Left Ear: External ear normal.  Nose: Nose normal.  Mouth/Throat: Oropharynx is clear and moist.  Eyes: Pupils are equal, round, and reactive to light. Conjunctivae are normal.  Neck: Normal range of motion. Neck supple. No JVD present. No tracheal deviation present. No thyromegaly present.  Cardiovascular: Normal rate, regular rhythm, normal heart sounds and intact distal pulses. Exam reveals no gallop and no friction rub.  No murmur heard. Pulmonary/Chest: Effort normal and breath sounds normal. No respiratory distress.  Breast - Normal exam w/ symmetric breasts, no mass, no nipple discharge, no skin changes or tenderness. Dense breast tissue.  Abdominal: Soft. Bowel sounds are normal. She exhibits no distension. There is no hepatosplenomegaly. There is no tenderness.  Musculoskeletal: Normal range of motion.  Lymphadenopathy:    She has no cervical adenopathy.  Neurological: She is alert and oriented to person, place, and time. No cranial nerve deficit.  Skin: Skin is warm and dry. Capillary refill takes less than 2 seconds.  Psychiatric: She has a normal mood and affect. Her  behavior is normal. Judgment and thought content normal.  Nursing note and vitals reviewed.    Results for orders placed or performed in visit on 03/27/17  CBC With Differential  Result Value Ref Range   WBC 6.4 3.4 - 10.8 x10E3/uL   RBC 4.56 3.77 - 5.28 x10E6/uL   Hemoglobin 13.7 11.1 - 15.9 g/dL   Hematocrit 40.6 34.0 - 46.6 %   MCV 89 79 - 97 fL   MCH 30.0 26.6 - 33.0 pg   MCHC 33.7 31.5 - 35.7 g/dL   RDW 14.3 12.3 - 15.4 %   Neutrophils 64 Not Estab. %   Lymphs 29 Not Estab. %   Monocytes 6 Not Estab. %   Eos 1 Not Estab. %   Basos 0 Not Estab. %   Neutrophils Absolute 4.1 1.4 - 7.0 x10E3/uL  Lymphocytes Absolute 1.9 0.7 - 3.1 x10E3/uL   Monocytes Absolute 0.4 0.1 - 0.9 x10E3/uL   EOS (ABSOLUTE) 0.1 0.0 - 0.4 x10E3/uL   Basophils Absolute 0.0 0.0 - 0.2 x10E3/uL   Immature Granulocytes 0 Not Estab. %   Immature Grans (Abs) 0.0 0.0 - 0.1 x10E3/uL  Vitamin B12  Result Value Ref Range   Vitamin B-12 695 232 - 1,245 pg/mL  Comprehensive metabolic panel  Result Value Ref Range   Glucose 76 65 - 99 mg/dL   BUN 10 6 - 24 mg/dL   Creatinine, Ser 0.84 0.57 - 1.00 mg/dL   GFR calc non Af Amer 87 >59 mL/min/1.73   GFR calc Af Amer 101 >59 mL/min/1.73   BUN/Creatinine Ratio 12 9 - 23   Sodium 138 134 - 144 mmol/L   Potassium 3.9 3.5 - 5.2 mmol/L   Chloride 103 96 - 106 mmol/L   CO2 19 (L) 20 - 29 mmol/L   Calcium 8.6 (L) 8.7 - 10.2 mg/dL   Total Protein 6.6 6.0 - 8.5 g/dL   Albumin 3.9 3.5 - 5.5 g/dL   Globulin, Total 2.7 1.5 - 4.5 g/dL   Albumin/Globulin Ratio 1.4 1.2 - 2.2   Bilirubin Total 0.3 0.0 - 1.2 mg/dL   Alkaline Phosphatase 78 39 - 117 IU/L   AST 18 0 - 40 IU/L   ALT 16 0 - 32 IU/L  Folate  Result Value Ref Range   Folate 12.8 >3.0 ng/mL  Hemoglobin A1c  Result Value Ref Range   Hgb A1c MFr Bld CANCELED %  Insulin, random  Result Value Ref Range   INSULIN 9.4 2.6 - 24.9 uIU/mL  Lipid Panel With LDL/HDL Ratio  Result Value Ref Range   Cholesterol, Total  175 100 - 199 mg/dL   Triglycerides 73 0 - 149 mg/dL   HDL 53 >39 mg/dL   VLDL Cholesterol Cal 15 5 - 40 mg/dL   LDL Calculated 107 (H) 0 - 99 mg/dL   LDl/HDL Ratio 2.0 0.0 - 3.2 ratio  T3  Result Value Ref Range   T3, Total 101 71 - 180 ng/dL  T4, free  Result Value Ref Range   Free T4 1.29 0.82 - 1.77 ng/dL  TSH  Result Value Ref Range   TSH 1.800 0.450 - 4.500 uIU/mL  VITAMIN D 25 Hydroxy (Vit-D Deficiency, Fractures)  Result Value Ref Range   Vit D, 25-Hydroxy 15.7 (L) 30.0 - 100.0 ng/mL      Assessment & Plan:   Problem List Items Addressed This Visit      Other   Vitamin D deficiency   Relevant Orders   VITAMIN D 25 Hydroxy (Vit-D Deficiency, Fractures)    Other Visit Diagnoses    Encounter for annual physical exam    -  Primary   Relevant Orders   VITAMIN D 25 Hydroxy (Vit-D Deficiency, Fractures)   TSH   Lipid panel   Hemoglobin A1c   COMPLETE METABOLIC PANEL WITH GFR   CBC with Differential/Platelet   Need for diphtheria-tetanus-pertussis (Tdap) vaccine       Relevant Orders   Tdap vaccine greater than or equal to 7yo IM (Completed)    Physical exam with no new findings.  Well adult with no acute concerns.  Plan: 1. Obtain health maintenance screenings as above according to age. - Increase physical activity to 30 minutes most days of the week.  - Eat healthy diet high in vegetables and fruits; low in refined carbohydrates; higher protein.  Provided low glycemic diet handout. - Reviewed condom use and best practices (reduce contamination, apply correctly) - Mammogram starting annually at age 60 after discussion.  Order placed.  Handout provided for patient to call and schedule. - Patient due for Tdap.  Administered today. 2. Return 1 year for annual physical.     Follow up plan: Return in about 1 year (around 07/26/2018) for annual physical.  [name], DNP, AGPCNP-BC Adult Gerontology Primary Care Nurse Practitioner [facility] 07/25/2017, 9:42 AM

## 2017-07-25 NOTE — Patient Instructions (Addendum)
Aimee Medina,   Thank you for coming in to clinic today.  1. Continue work on healthy eating.  Consider non-meat sources of protein like eggs, nuts, quinoa.  2. Increase your physical activity until you are increasing your heart rate for 30 minutes on most days of the week.  3. Your mammogram order has been placed.  Call the Scheduling phone number at [phone] to schedule your mammogram at your convenience.  You can choose to go to either location listed below.  Let the scheduler know which location you prefer.  [place]  [place], [place] [address]   [facility] [address] [place], [place] [address]   Please schedule a follow-up appointment with [name], AGNP. Return in about 1 year (around 07/26/2018) for annual physical.  If you have any other questions or concerns, please feel free to call the clinic or send a message through [place]. You may also schedule an earlier appointment if necessary.  You will receive a survey after today's visit either digitally by e-mail or paper by [employer]. Your experiences and feedback matter to [place].  Please respond so we know how we are doing as we provide care for you.   [name], DNP, AGNP-BC Adult Gerontology Nurse Practitioner [facility]

## 2017-08-14 ENCOUNTER — Ambulatory Visit
Admission: RE | Admit: 2017-08-14 | Discharge: 2017-08-14 | Disposition: A | Discharge status: Home / Self Care | Payer: No Typology Code available for payment source | Source: Ambulatory Visit | Attending: Nurse Practitioner | Admitting: Nurse Practitioner

## 2017-08-14 DIAGNOSIS — Z1231 Encounter for screening mammogram for malignant neoplasm of breast: Secondary | ICD-10-CM | POA: Insufficient documentation

## 2017-08-14 DIAGNOSIS — Z1239 Encounter for other screening for malignant neoplasm of breast: Secondary | ICD-10-CM

## 2017-10-14 ENCOUNTER — Encounter: Payer: Self-pay | Admitting: Nurse Practitioner

## 2017-10-18 ENCOUNTER — Ambulatory Visit (INDEPENDENT_AMBULATORY_CARE_PROVIDER_SITE_OTHER): Payer: No Typology Code available for payment source

## 2017-10-18 DIAGNOSIS — Z23 Encounter for immunization: Secondary | ICD-10-CM | POA: Diagnosis not present

## 2017-10-18 NOTE — Progress Notes (Signed)
[id number]

## 2018-02-24 ENCOUNTER — Encounter: Payer: Self-pay | Admitting: Nurse Practitioner

## 2018-02-24 DIAGNOSIS — M549 Dorsalgia, unspecified: Secondary | ICD-10-CM

## 2018-02-25 ENCOUNTER — Encounter: Payer: Self-pay | Admitting: Nurse Practitioner

## 2018-02-25 NOTE — Telephone Encounter (Signed)
Incoming message

## 2018-06-20 ENCOUNTER — Telehealth: Payer: Self-pay

## 2018-06-20 DIAGNOSIS — L932 Other local lupus erythematosus: Secondary | ICD-10-CM

## 2018-06-20 NOTE — Addendum Note (Signed)
Addended by: Olin Hauser on: 06/20/2018 10:48 AM   Modules accepted: Orders

## 2018-06-20 NOTE — Telephone Encounter (Signed)
Referral to continue care at [facility] (Dr Aubery Lapping) for cutaneous lupus   Nobie Putnam, DO [facility] 06/20/2018, 10:48 AM

## 2018-06-20 NOTE — Telephone Encounter (Signed)
The pt sent a message requesting a new dermatology referral , because her previous referral expired.

## 2018-07-10 ENCOUNTER — Encounter: Payer: Self-pay | Admitting: Nurse Practitioner

## 2018-07-16 DIAGNOSIS — D352 Benign neoplasm of pituitary gland: Secondary | ICD-10-CM | POA: Insufficient documentation

## 2018-07-16 HISTORY — DX: Benign neoplasm of pituitary gland: D35.2

## 2018-07-17 ENCOUNTER — Ambulatory Visit (INDEPENDENT_AMBULATORY_CARE_PROVIDER_SITE_OTHER): Payer: No Typology Code available for payment source | Admitting: Obstetrics & Gynecology

## 2018-07-17 ENCOUNTER — Encounter: Payer: Self-pay | Admitting: Obstetrics & Gynecology

## 2018-07-17 ENCOUNTER — Other Ambulatory Visit: Payer: Self-pay

## 2018-07-17 VITALS — BP 136/83 | HR 75 | Ht 67.0 in | Wt 241.0 lb

## 2018-07-17 DIAGNOSIS — N898 Other specified noninflammatory disorders of vagina: Secondary | ICD-10-CM

## 2018-07-17 DIAGNOSIS — N760 Acute vaginitis: Secondary | ICD-10-CM

## 2018-07-17 DIAGNOSIS — B9689 Other specified bacterial agents as the cause of diseases classified elsewhere: Secondary | ICD-10-CM | POA: Diagnosis not present

## 2018-07-17 DIAGNOSIS — Z113 Encounter for screening for infections with a predominantly sexual mode of transmission: Secondary | ICD-10-CM

## 2018-07-17 DIAGNOSIS — Z01419 Encounter for gynecological examination (general) (routine) without abnormal findings: Secondary | ICD-10-CM | POA: Diagnosis not present

## 2018-07-17 DIAGNOSIS — Z124 Encounter for screening for malignant neoplasm of cervix: Secondary | ICD-10-CM | POA: Diagnosis not present

## 2018-07-17 DIAGNOSIS — R7989 Other specified abnormal findings of blood chemistry: Secondary | ICD-10-CM

## 2018-07-17 DIAGNOSIS — Z1151 Encounter for screening for human papillomavirus (HPV): Secondary | ICD-10-CM

## 2018-07-17 DIAGNOSIS — R87610 Atypical squamous cells of undetermined significance on cytologic smear of cervix (ASC-US): Secondary | ICD-10-CM | POA: Insufficient documentation

## 2018-07-17 DIAGNOSIS — N939 Abnormal uterine and vaginal bleeding, unspecified: Secondary | ICD-10-CM

## 2018-07-17 NOTE — Patient Instructions (Signed)
Abnormal Uterine Bleeding Abnormal uterine bleeding means bleeding more than usual from your uterus. It can include:  Bleeding between periods.  Bleeding after sex.  Bleeding that is heavier than normal.  Periods that last longer than usual.  Bleeding after you have stopped having your period (menopause). There are many problems that may cause this. You should see a doctor for any kind of bleeding that is not normal. Treatment depends on the cause of the bleeding. Follow these instructions at home:  Watch your condition for any changes.  Do not use tampons, douche, or have sex, if your doctor tells you not to.  Change your pads often.  Get regular well-woman exams. Make sure they include a pelvic exam and cervical cancer screening.  Keep all follow-up visits as told by your doctor. This is important. Contact a doctor if:  The bleeding lasts more than one week.  You feel dizzy at times.  You feel like you are going to throw up (nauseous).  You throw up. Get help right away if:  You pass out.  You have to change pads every hour.  You have belly (abdominal) pain.  You have a fever.  You get sweaty.  You get weak.  You passing large blood clots from your vagina. Summary  Abnormal uterine bleeding means bleeding more than usual from your uterus.  There are many problems that may cause this. You should see a doctor for any kind of bleeding that is not normal.  Treatment depends on the cause of the bleeding. This information is not intended to replace advice given to you by your health care provider. Make sure you discuss any questions you have with your health care provider. Document Released: 10/29/2008 Document Revised: 12/27/2015 Document Reviewed: 12/27/2015 Elsevier Patient Education  2020 Elsevier Inc.    Preventive Care 44-76 Years Old, Female Preventive care refers to visits with your health care provider and lifestyle choices that can promote health  and wellness. This includes:  A yearly physical exam. This may also be called an annual well check.  Regular dental visits and eye exams.  Immunizations.  Screening for certain conditions.  Healthy lifestyle choices, such as eating a healthy diet, getting regular exercise, not using drugs or products that contain nicotine and tobacco, and limiting alcohol use. What can I expect for my preventive care visit? Physical exam Your health care provider will check your:  Height and weight. This may be used to calculate body mass index (BMI), which tells if you are at a healthy weight.  Heart rate and blood pressure.  Skin for abnormal spots. Counseling Your health care provider may ask you questions about your:  Alcohol, tobacco, and drug use.  Emotional well-being.  Home and relationship well-being.  Sexual activity.  Eating habits.  Work and work Statistician.  Method of birth control.  Menstrual cycle.  Pregnancy history. What immunizations do I need?  Influenza (flu) vaccine  This is recommended every year. Tetanus, diphtheria, and pertussis (Tdap) vaccine  You may need a Td booster every 10 years. Varicella (chickenpox) vaccine  You may need this if you have not been vaccinated. Zoster (shingles) vaccine  You may need this after age 23. Measles, mumps, and rubella (MMR) vaccine  You may need at least one dose of MMR if you were born in 1957 or later. You may also need a second dose. Pneumococcal conjugate (PCV13) vaccine  You may need this if you have certain conditions and were not previously vaccinated.  Pneumococcal polysaccharide (PPSV23) vaccine  You may need one or two doses if you smoke cigarettes or if you have certain conditions. Meningococcal conjugate (MenACWY) vaccine  You may need this if you have certain conditions. Hepatitis A vaccine  You may need this if you have certain conditions or if you travel or work in places where you may be  exposed to hepatitis A. Hepatitis B vaccine  You may need this if you have certain conditions or if you travel or work in places where you may be exposed to hepatitis B. Haemophilus influenzae type b (Hib) vaccine  You may need this if you have certain conditions. Human papillomavirus (HPV) vaccine  If recommended by your health care provider, you may need three doses over 6 months. You may receive vaccines as individual doses or as more than one vaccine together in one shot (combination vaccines). Talk with your health care provider about the risks and benefits of combination vaccines. What tests do I need? Blood tests  Lipid and cholesterol levels. These may be checked every 5 years, or more frequently if you are over 84 years old.  Hepatitis C test.  Hepatitis B test. Screening  Lung cancer screening. You may have this screening every year starting at age 59 if you have a 30-pack-year history of smoking and currently smoke or have quit within the past 15 years.  Colorectal cancer screening. All adults should have this screening starting at age 82 and continuing until age 55. Your health care provider may recommend screening at age 80 if you are at increased risk. You will have tests every 1-10 years, depending on your results and the type of screening test.  Diabetes screening. This is done by checking your blood sugar (glucose) after you have not eaten for a while (fasting). You may have this done every 1-3 years.  Mammogram. This may be done every 1-2 years. Talk with your health care provider about when you should start having regular mammograms. This may depend on whether you have a family history of breast cancer.  BRCA-related cancer screening. This may be done if you have a family history of breast, ovarian, tubal, or peritoneal cancers.  Pelvic exam and Pap test. This may be done every 3 years starting at age 45. Starting at age 75, this may be done every 5 years if you have  a Pap test in combination with an HPV test. Other tests  Sexually transmitted disease (STD) testing.  Bone density scan. This is done to screen for osteoporosis. You may have this scan if you are at high risk for osteoporosis. Follow these instructions at home: Eating and drinking  Eat a diet that includes fresh fruits and vegetables, whole grains, lean protein, and low-fat dairy.  Take vitamin and mineral supplements as recommended by your health care provider.  Do not drink alcohol if: ? Your health care provider tells you not to drink. ? You are pregnant, may be pregnant, or are planning to become pregnant.  If you drink alcohol: ? Limit how much you have to 0-1 drink a day. ? Be aware of how much alcohol is in your drink. In the [place] one drink equals one 12 oz bottle of beer (355 mL), one 5 oz glass of wine (148 mL), or one 1 oz glass of hard liquor (44 mL). Lifestyle  Take daily care of your teeth and gums.  Stay active. Exercise for at least 30 minutes on 5 or more days each week.  Do not use any products that contain nicotine or tobacco, such as cigarettes, e-cigarettes, and chewing tobacco. If you need help quitting, ask your health care provider.  If you are sexually active, practice safe sex. Use a condom or other form of birth control (contraception) in order to prevent pregnancy and STIs (sexually transmitted infections).  If told by your health care provider, take low-dose aspirin daily starting at age 55. What's next?  Visit your health care provider once a year for a well check visit.  Ask your health care provider how often you should have your eyes and teeth checked.  Stay up to date on all vaccines. This information is not intended to replace advice given to you by your health care provider. Make sure you discuss any questions you have with your health care provider. Document Released: 01/28/2015 Document Revised: 09/12/2017 Document Reviewed: 09/12/2017  Elsevier Patient Education  2020 [employer].

## 2018-07-17 NOTE — Progress Notes (Signed)
Started spotting today and hasn't had a period since Feb 26, pt states she has always had regular periods so this is not normal for her  Had mammo last year and was normal, PCP provides referrals

## 2018-07-17 NOTE — Progress Notes (Signed)
GYNECOLOGY ANNUAL PREVENTATIVE CARE ENCOUNTER NOTE  History:     Aimee Medina is a 43 y.o. [phone] female here for a routine annual gynecologic exam.  Current complaints: had no periods since 2/20 but now started spotting. Never had irregular periods before.  No big life stressors, weight changes, headaches, nipple drainage, or other symptoms.   Denies abnormal vaginal discharge, pelvic pain, problems with intercourse or other gynecologic concerns.    Gynecologic History Patient's last menstrual period was 07/17/2018 (exact date). Contraception: none Last Pap: 2018. Results were: normal with negative HPV Last mammogram: 07/2017. Results were: normal  Obstetric History OB History  Gravida Para Term Preterm AB Living  4 2 2  0 2 2  SAB TAB Ectopic Multiple Live Births  1 1 0 0 2    # Outcome Date GA Lbr Len/2nd Weight Sex Delivery Anes PTL Lv  4 TAB      TAB     3 SAB      SAB     2 Term      Vag-Spont   LIV  1 Term      Vag-Spont   LIV    Past Medical History:  Diagnosis Date  . Allergy   . Cutaneous lupus erythematosus   . Dermatitis   . Obesity   . Perennial allergic rhinitis   . Psoriasis     Past Surgical History:  Procedure Laterality Date  . ABDOMINOPLASTY  2015  . APPENDECTOMY  2011    Current Outpatient Medications on File Prior to Visit  Medication Sig Dispense Refill  . HALOG 0.1 % OINT APPLY TOPICALLY TO THE AFFECTED AREA EVERY DAY  0   No current facility-administered medications on file prior to visit.     No Known Allergies  Social History:  reports that she has never smoked. She has never used smokeless tobacco. She reports that she does not drink alcohol or use drugs.  Family History  Problem Relation Age of Onset  . High blood pressure Mother   . Heart disease Mother   . Stroke Mother   . Hypertension Father   . Stroke Father   . Prostate cancer Father 41       prostate- currently in remission  . High Cholesterol Father   . Allergies  Daughter   . Allergies Son   . Pancreatic cancer Maternal Grandmother 79  . Colon cancer Maternal Grandfather 49  . Prostate cancer Paternal Grandfather 72    The following portions of the patient's history were reviewed and updated as appropriate: allergies, current medications, past family history, past medical history, past social history, past surgical history and problem list.  Review of Systems Pertinent items noted in HPI and remainder of comprehensive ROS otherwise negative.  Physical Exam:  BP 136/83   Pulse 75   Ht 5\' 7"  (1.702 m)   Wt 241 lb (109.3 kg)   LMP 07/17/2018 (Exact Date)   BMI 37.75 kg/m  CONSTITUTIONAL: Well-developed, well-nourished female in no acute distress.  HENT:  Normocephalic, atraumatic, External right and left ear normal. Oropharynx is clear and moist EYES: Conjunctivae and EOM are normal. Pupils are equal, round, and reactive to light. No scleral icterus.  NECK: Normal range of motion, supple, no masses.  Normal thyroid.  SKIN: Skin is warm and dry. No rash noted. Not diaphoretic. No erythema. No pallor. MUSCULOSKELETAL: Normal range of motion. No tenderness.  No cyanosis, clubbing, or edema.  2+ distal pulses. NEUROLOGIC: Alert and  oriented to person, place, and time. Normal reflexes, muscle tone coordination. No cranial nerve deficit noted. PSYCHIATRIC: Normal mood and affect. Normal behavior. Normal judgment and thought content. CARDIOVASCULAR: Normal heart rate noted, regular rhythm RESPIRATORY: Clear to auscultation bilaterally. Effort and breath sounds normal, no problems with respiration noted. BREASTS: Symmetric in size. No masses, skin changes, nipple drainage, or lymphadenopathy. ABDOMEN: Soft, normal bowel sounds, no distention noted.  No tenderness, rebound or guarding.  PELVIC: Normal appearing external genitalia; normal appearing vaginal mucosa and cervix.  Scant bloody discharge noted.  Pap smear obtained.  Normal uterine size, no other  palpable masses, no uterine or adnexal tenderness.  Labs: UPT: Negative    Assessment and Plan:    1. Abnormal uterine bleeding (AUB) Patient's irregular menses are most likely of an anovulatory etiology.  Differential diagnoses include  thyroid dysfunction,  elevated prolactin dysfunction, stress, significant weight loss, exercise or anything can disturb the hypothalamic-pituitary-ovarian axis.  Will do labs now for evaluation, may need imaging if this persists. Negative UPT today.  Patient was told to use a menstrual calendar for record keeping.  Will follow up results and manage accordingly. - TSH - T3, free - T4, free - CBC - Prolactin  2. Well woman exam with routine gynecological exam Desires routine healthcare maintenance labs and comprehensive annual STI screen. - CBC - Cervicovaginal ancillary only( [place]) - Lipid panel - Hemoglobin A1c - Comprehensive metabolic panel - Hepatitis B surface antigen - Hepatitis C antibody - HIV Antibody (routine testing w rflx) - RPR - Cytology - PAP Will follow up results of pap smear and manage accordingly. Mammogram to be scheduled by patient soon. Routine preventative health maintenance measures emphasized. Please refer to After Visit Summary for other counseling recommendations.      Verita Schneiders, MD, [place] for [employer] [facility], [place]

## 2018-07-18 LAB — CBC
Hematocrit: 38.5 % (ref 34.0–46.6)
Hemoglobin: 13.1 g/dL (ref 11.1–15.9)
MCH: 29.6 pg (ref 26.6–33.0)
MCHC: 34 g/dL (ref 31.5–35.7)
MCV: 87 fL (ref 79–97)
Platelets: 301 10*3/uL (ref 150–450)
RBC: 4.43 x10E6/uL (ref 3.77–5.28)
RDW: 12.8 % (ref 11.7–15.4)
WBC: 6 10*3/uL (ref 3.4–10.8)

## 2018-07-18 LAB — COMPREHENSIVE METABOLIC PANEL
ALT: 15 IU/L (ref 0–32)
AST: 18 IU/L (ref 0–40)
Albumin/Globulin Ratio: 1.9 (ref 1.2–2.2)
Albumin: 4.1 g/dL (ref 3.8–4.8)
Alkaline Phosphatase: 78 IU/L (ref 39–117)
BUN/Creatinine Ratio: 8 — ABNORMAL LOW (ref 9–23)
BUN: 8 mg/dL (ref 6–24)
Bilirubin Total: 0.3 mg/dL (ref 0.0–1.2)
CO2: 21 mmol/L (ref 20–29)
Calcium: 8.6 mg/dL — ABNORMAL LOW (ref 8.7–10.2)
Chloride: 102 mmol/L (ref 96–106)
Creatinine, Ser: 0.95 mg/dL (ref 0.57–1.00)
GFR calc Af Amer: 86 mL/min/{1.73_m2} (ref >59)
GFR calc non Af Amer: 75 mL/min/{1.73_m2} (ref >59)
Globulin, Total: 2.2 g/dL (ref 1.5–4.5)
Glucose: 82 mg/dL (ref 65–99)
Potassium: 4 mmol/L (ref 3.5–5.2)
Sodium: 136 mmol/L (ref 134–144)
Total Protein: 6.3 g/dL (ref 6.0–8.5)

## 2018-07-18 LAB — LIPID PANEL
Chol/HDL Ratio: 3.5 ratio (ref 0.0–4.4)
Cholesterol, Total: 159 mg/dL (ref 100–199)
HDL: 46 mg/dL (ref >39)
LDL Calculated: 102 mg/dL — ABNORMAL HIGH (ref 0–99)
Triglycerides: 54 mg/dL (ref 0–149)
VLDL Cholesterol Cal: 11 mg/dL (ref 5–40)

## 2018-07-18 LAB — HEPATITIS C ANTIBODY: Hep C Virus Ab: <0.1 s/co ratio (ref 0.0–0.9)

## 2018-07-18 LAB — HEMOGLOBIN A1C
Est. average glucose Bld gHb Est-mCnc: 105 mg/dL
Hgb A1c MFr Bld: 5.3 % (ref 4.8–5.6)

## 2018-07-18 LAB — PROLACTIN: Prolactin: 99.4 ng/mL — ABNORMAL HIGH (ref 4.8–23.3)

## 2018-07-18 LAB — T3, FREE: T3, Free: 2.3 pg/mL (ref 2.0–4.4)

## 2018-07-18 LAB — T4, FREE: Free T4: 1.38 ng/dL (ref 0.82–1.77)

## 2018-07-18 LAB — RPR: RPR Ser Ql: NONREACTIVE

## 2018-07-18 LAB — HIV ANTIBODY (ROUTINE TESTING W REFLEX): HIV Screen 4th Generation wRfx: NONREACTIVE

## 2018-07-18 LAB — TSH: TSH: 1.72 u[IU]/mL (ref 0.450–4.500)

## 2018-07-18 LAB — HEPATITIS B SURFACE ANTIGEN: Hepatitis B Surface Ag: NEGATIVE

## 2018-07-19 DIAGNOSIS — R7989 Other specified abnormal findings of blood chemistry: Secondary | ICD-10-CM | POA: Insufficient documentation

## 2018-07-19 NOTE — Addendum Note (Signed)
Addended by: Verita Schneiders A on: 07/19/2018 11:48 AM   Modules accepted: Orders

## 2018-07-19 NOTE — Progress Notes (Signed)
Lab Addendum   Fasting prolactin level elevated at 99.4 ng/mL; this could be cause of her irregular bleeding episode.  Concerned about prolactin secreting mass in the pituitary (prolactinoma) which can be very small. After discussion with a neuroradiologist, I ordered MRI brain with and without contrast, with attention to the pituitary. This will need to be scheduled for patient. I called patient and it went straight to voicemail twice, I told her to check her MyChart and also to expect call from the office on 07/21/18.  Results were released to MyChart and patient was given recommendations as indicated.  Verita Schneiders, MD

## 2018-07-21 ENCOUNTER — Telehealth: Payer: Self-pay | Admitting: Radiology

## 2018-07-21 LAB — CERVICOVAGINAL ANCILLARY ONLY
Bacterial vaginitis: POSITIVE — AB
Candida vaginitis: NEGATIVE
Chlamydia: NEGATIVE
Neisseria Gonorrhea: NEGATIVE
Trichomonas: NEGATIVE

## 2018-07-21 NOTE — Telephone Encounter (Signed)
Left message for patient to call [id number], need to know a good time and date for patient to be seen at [facility] for MRI per Dr Harolyn Rutherford.

## 2018-07-22 MED ORDER — METRONIDAZOLE 500 MG PO TABS: 500.0000 mg | ORAL_TABLET | Freq: Two times a day (BID) | ORAL | 0 refills | Status: DC

## 2018-07-22 NOTE — Addendum Note (Signed)
Addended by: Verita Schneiders A on: 07/22/2018 10:04 AM   Modules accepted: Orders

## 2018-07-23 ENCOUNTER — Encounter: Payer: Self-pay | Admitting: Obstetrics & Gynecology

## 2018-07-23 LAB — CYTOLOGY - PAP
Diagnosis: UNDETERMINED — AB
HPV: NOT DETECTED

## 2018-07-31 ENCOUNTER — Other Ambulatory Visit: Payer: Self-pay | Admitting: Family Medicine

## 2018-08-01 ENCOUNTER — Ambulatory Visit
Admission: RE | Admit: 2018-08-01 | Discharge: 2018-08-01 | Disposition: A | Discharge status: Home / Self Care | Payer: No Typology Code available for payment source | Source: Ambulatory Visit | Attending: Obstetrics & Gynecology | Admitting: Obstetrics & Gynecology

## 2018-08-01 ENCOUNTER — Other Ambulatory Visit: Payer: Self-pay

## 2018-08-01 DIAGNOSIS — N939 Abnormal uterine and vaginal bleeding, unspecified: Secondary | ICD-10-CM | POA: Insufficient documentation

## 2018-08-01 DIAGNOSIS — E229 Hyperfunction of pituitary gland, unspecified: Secondary | ICD-10-CM | POA: Diagnosis present

## 2018-08-01 DIAGNOSIS — R7989 Other specified abnormal findings of blood chemistry: Secondary | ICD-10-CM

## 2018-08-01 LAB — VITAMIN D 25 HYDROXY (VIT D DEFICIENCY, FRACTURES): Vit D, 25-Hydroxy: 23 ng/mL — ABNORMAL LOW (ref 30–100)

## 2018-08-01 MED ORDER — GADOBUTROL 1 MMOL/ML IV SOLN
10.0000 mL | Freq: Once | INTRAVENOUS | Status: AC | PRN
  Administered 2018-08-01: 10 mL via INTRAVENOUS

## 2018-08-04 ENCOUNTER — Encounter: Payer: Self-pay | Admitting: Nurse Practitioner

## 2018-08-04 DIAGNOSIS — D352 Benign neoplasm of pituitary gland: Secondary | ICD-10-CM

## 2018-08-28 ENCOUNTER — Telehealth: Payer: No Typology Code available for payment source | Admitting: Family

## 2018-08-28 DIAGNOSIS — N760 Acute vaginitis: Secondary | ICD-10-CM | POA: Diagnosis not present

## 2018-08-28 MED ORDER — CLINDAMYCIN PHOSPHATE 2 % VA CREA: 1.0000 | TOPICAL_CREAM | Freq: Every day | VAGINAL | 0 refills | Status: DC

## 2018-08-28 NOTE — Progress Notes (Signed)
We are sorry that you are not feeling well. Here is how we plan to help! Based on what you shared with me it looks like you: May have a vaginosis due to bacteria  Vaginosis is an inflammation of the vagina that can result in discharge, itching and pain. The cause is usually a change in the normal balance of vaginal bacteria or an infection. Vaginosis can also result from reduced estrogen levels after menopause.  The most common causes of vaginosis are:   Bacterial vaginosis which results from an overgrowth of one on several organisms that are normally present in your vagina.   Yeast infections which are caused by a naturally occurring fungus called candida.   Vaginal atrophy (atrophic vaginosis) which results from the thinning of the vagina from reduced estrogen levels after menopause.   Trichomoniasis which is caused by a parasite and is commonly transmitted by sexual intercourse.  Factors that increase your risk of developing vaginosis include: . Medications, such as antibiotics and steroids . Uncontrolled diabetes . Use of hygiene products such as bubble bath, vaginal spray or vaginal deodorant . Douching . Wearing damp or tight-fitting clothing . Using an intrauterine device (IUD) for birth control . Hormonal changes, such as those associated with pregnancy, birth control pills or menopause . Sexual activity . Having a sexually transmitted infection  Your treatment plan is Clindamycin vaginal cream 5 grams applied vaginally for 7 days.  I have electronically sent this prescription into the pharmacy that you have chosen.  Be sure to take all of the medication as directed. Stop taking any medication if you develop a rash, tongue swelling or shortness of breath. Mothers who are breast feeding should consider pumping and discarding their breast milk while on these antibiotics. However, there is no consensus that infant exposure at these doses would be harmful.  Remember that medication  creams can weaken latex condoms. .   HOME CARE:  Good hygiene may prevent some types of vaginosis from recurring and may relieve some symptoms:  . Avoid baths, hot tubs and whirlpool spas. Rinse soap from your outer genital area after a shower, and dry the area well to prevent irritation. Don't use scented or harsh soaps, such as those with deodorant or antibacterial action. . Avoid irritants. These include scented tampons and pads. . Wipe from front to back after using the toilet. Doing so avoids spreading fecal bacteria to your vagina.  Other things that may help prevent vaginosis include:  . Don't douche. Your vagina doesn't require cleansing other than normal bathing. Repetitive douching disrupts the normal organisms that reside in the vagina and can actually increase your risk of vaginal infection. Douching won't clear up a vaginal infection. . Use a latex condom. Both female and female latex condoms may help you avoid infections spread by sexual contact. . Wear cotton underwear. Also wear pantyhose with a cotton crotch. If you feel comfortable without it, skip wearing underwear to bed. Yeast thrives in moist environments Your symptoms should improve in the next day or two.  GET HELP RIGHT AWAY IF:  . You have pain in your lower abdomen ( pelvic area or over your ovaries) . You develop nausea or vomiting . You develop a fever . Your discharge changes or worsens . You have persistent pain with intercourse . You develop shortness of breath, a rapid pulse, or you faint.  These symptoms could be signs of problems or infections that need to be evaluated by a medical provider now.    MAKE SURE YOU    Understand these instructions.  Will watch your condition.  Will get help right away if you are not doing well or get worse.  Your e-visit answers were reviewed by a board certified advanced clinical practitioner to complete your personal care plan. Depending upon the condition, your  plan could have included both over the counter or prescription medications. Please review your pharmacy choice to make sure that you have choses a pharmacy that is open for you to pick up any needed prescription, Your safety is important to [place]. If you have drug allergies check your prescription carefully.   You can use MyChart to ask questions about today's visit, request a non-urgent call back, or ask for a work or school excuse for 24 hours related to this e-Visit. If it has been greater than 24 hours you will need to follow up with your provider, or enter a new e-Visit to address those concerns. You will get a MyChart message within the next two days asking about your experience. I hope that your e-visit has been valuable and will speed your recovery.  Greater than 5 minutes, yet less than 10 minutes of time have been spent researching, coordinating, and implementing care for this patient today.  Thank you for the details you included in the comment boxes. Those details are very helpful in determining the best course of treatment for you and help [place] to provide the best care.

## 2018-09-15 ENCOUNTER — Other Ambulatory Visit: Payer: Self-pay | Admitting: Nurse Practitioner

## 2018-09-15 DIAGNOSIS — Z1231 Encounter for screening mammogram for malignant neoplasm of breast: Secondary | ICD-10-CM

## 2018-10-30 ENCOUNTER — Ambulatory Visit
Admission: RE | Admit: 2018-10-30 | Discharge: 2018-10-30 | Disposition: A | Discharge status: Home / Self Care | Payer: No Typology Code available for payment source | Source: Ambulatory Visit | Attending: Nurse Practitioner | Admitting: Nurse Practitioner

## 2018-10-30 DIAGNOSIS — Z1231 Encounter for screening mammogram for malignant neoplasm of breast: Secondary | ICD-10-CM | POA: Insufficient documentation

## 2019-03-16 ENCOUNTER — Encounter: Payer: Self-pay | Admitting: Family

## 2019-03-16 ENCOUNTER — Ambulatory Visit (INDEPENDENT_AMBULATORY_CARE_PROVIDER_SITE_OTHER): Payer: No Typology Code available for payment source | Admitting: Family

## 2019-03-16 ENCOUNTER — Telehealth: Payer: Self-pay | Admitting: Family

## 2019-03-16 VITALS — Ht 67.0 in | Wt 234.0 lb

## 2019-03-16 DIAGNOSIS — R7989 Other specified abnormal findings of blood chemistry: Secondary | ICD-10-CM

## 2019-03-16 DIAGNOSIS — E669 Obesity, unspecified: Secondary | ICD-10-CM

## 2019-03-16 NOTE — Assessment & Plan Note (Signed)
Chronic.  Certainly understand patient's frustrations.  I think it is most advantageous if  the patient returns Dr. Redgie Grayer which patient was in agreement with.  Referral has been placed.  Will follow

## 2019-03-16 NOTE — Telephone Encounter (Signed)
LVM to return in about 6 months (around 09/16/2019) for Complete Physical Exam.

## 2019-03-16 NOTE — Progress Notes (Signed)
Virtual Visit via Video Note  I connected with@  on 03/16/19 at 11:00 AM EST by a video enabled telemedicine application and verified that I am speaking with the correct person using two identifiers.  Location patient: home Location provider:work  Persons participating in the virtual visit: patient, provider  I discussed the limitations of evaluation and management by telemedicine and the availability of in person appointments. The patient expressed understanding and agreed to proceed.  Interactive audio and video telecommunications were attempted between this provider and patient, and for most of the appointment worked well.  However after several minutes, the video failed, due to patient having technical difficulties or patient did not have access to video capability.  We continued and completed visit with audio only.    HPI:  Prior PCP left the practice last year.  Establishing care today Frustrated by weight.  Has gained about 10 lbs over the past year. Goal to be less than 180 lbs.  Skips breakfast. No regular meals, snacks more often. Endorses sweet tooth. Has seen Dr Leafy Ro in the past however stopped as was hard to get to [place]- follows with [facility]. Doing well on cabergoline.   ROS: See pertinent positives and negatives per HPI.  Past Medical History:  Diagnosis Date  . Allergy   . Cutaneous lupus erythematosus   . Dermatitis   . Obesity   . Perennial allergic rhinitis   . Psoriasis   . Seasonal and perennial allergic rhinitis 01/21/2015  . Vitamin D deficiency 03/27/2017    Past Surgical History:  Procedure Laterality Date  . ABDOMINOPLASTY  2015  . APPENDECTOMY  2011    Family History  Problem Relation Age of Onset  . High blood pressure Mother   . Heart disease Mother   . Stroke Mother   . Hypertension Father   . Stroke Father   . Prostate cancer Father 20       prostate- currently in remission  . High Cholesterol Father   . Allergies  Daughter   . Allergies Son   . Pancreatic cancer Maternal Grandmother 75  . Colon cancer Maternal Grandfather 73  . Prostate cancer Paternal Grandfather 37       Current Outpatient Medications:  .  cabergoline (DOSTINEX) 0.5 MG tablet, Take 0.5 mg by mouth 2 (two) times a week., Disp: , Rfl:  .  ergocalciferol (VITAMIN D2) 1.25 MG (50000 UT) capsule, Take by mouth., Disp: , Rfl:  .  HALOG 0.1 % OINT, APPLY TOPICALLY TO THE AFFECTED AREA EVERY DAY, Disp: , Rfl: 0 .  clindamycin (CLEOCIN) 2 % vaginal cream, Place 1 Applicatorful vaginally at bedtime., Disp: 40 g, Rfl: 0 .  metroNIDAZOLE (FLAGYL) 500 MG tablet, Take 1 tablet (500 mg total) by mouth 2 (two) times daily., Disp: 14 tablet, Rfl: 0  EXAM:  VITALS per patient if applicable: Filed Weights   03/16/19 1114  Weight: 234 lb (106.1 kg)   Body mass index is 36.65 kg/m.  GENERAL: alert, oriented, appears well and in no acute distress  HEENT: atraumatic, conjunttiva clear, no obvious abnormalities on inspection of external nose and ears  NECK: normal movements of the head and neck  LUNGS: on inspection no signs of respiratory distress, breathing rate appears normal, no obvious gross SOB, gasping or wheezing  CV: no obvious cyanosis  MS: moves all visible extremities without noticeable abnormality  PSYCH/NEURO: pleasant and cooperative, no obvious depression or anxiety, speech and thought processing grossly intact  ASSESSMENT AND PLAN:  Discussed the following assessment and plan:  Obesity, Class II, BMI 35-39.9, isolated - Plan: Amb Ref to Medical Weight Management  Elevated prolactin level Problem List Items Addressed This Visit      Other   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 Will follow      Obesity, Class II, BMI 35-39.9, isolated - Primary    Chronic.  Certainly understand patient's frustrations.  I think it is most advantageous if  the patient returns  Dr. Redgie Grayer which patient was in agreement with.  Referral has been placed.  Will follow      Relevant Orders   Amb Ref to Medical Weight Management      -we discussed possible serious and likely etiologies, options for evaluation and workup, limitations of telemedicine visit vs in person visit, treatment, treatment risks and precautions. Pt prefers to treat via telemedicine empirically rather then risking or undertaking an in person visit at this moment. Patient agrees to seek prompt in person care if worsening, new symptoms arise, or if is not improving with treatment.   I discussed the assessment and treatment plan with the patient. The patient was provided an opportunity to ask questions and all were answered. The patient agreed with the plan and demonstrated an understanding of the instructions.   The patient was advised to call back or seek an in-person evaluation if the symptoms worsen or if the condition fails to improve as anticipated.   Aimee Paris, FNP  I have spent 18  minutes with a patient including  reviewing medical records, and discussion plan of care.

## 2019-03-16 NOTE — Assessment & Plan Note (Signed)
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 Will follow

## 2019-03-23 ENCOUNTER — Telehealth: Payer: Self-pay | Admitting: Family

## 2019-03-23 NOTE — Telephone Encounter (Signed)
error 

## 2019-07-06 ENCOUNTER — Telehealth: Payer: Self-pay | Admitting: Family

## 2019-07-06 NOTE — Telephone Encounter (Signed)
Rejection Reason - Patient did not respond" [facility] said about 21 hours ago  Pt also had a vm left in May   I called pt and  Left a vm to call ofc.

## 2019-09-18 ENCOUNTER — Encounter: Payer: No Typology Code available for payment source | Admitting: Family

## 2019-10-30 ENCOUNTER — Ambulatory Visit (INDEPENDENT_AMBULATORY_CARE_PROVIDER_SITE_OTHER): Payer: No Typology Code available for payment source | Admitting: Family

## 2019-10-30 ENCOUNTER — Encounter: Payer: Self-pay | Admitting: Family

## 2019-10-30 ENCOUNTER — Other Ambulatory Visit: Payer: Self-pay | Admitting: Family

## 2019-10-30 ENCOUNTER — Other Ambulatory Visit: Payer: Self-pay

## 2019-10-30 ENCOUNTER — Ambulatory Visit (INDEPENDENT_AMBULATORY_CARE_PROVIDER_SITE_OTHER): Payer: No Typology Code available for payment source

## 2019-10-30 VITALS — BP 130/86 | HR 81 | Temp 98.1°F | Ht 66.0 in | Wt 240.1 lb

## 2019-10-30 DIAGNOSIS — R0789 Other chest pain: Secondary | ICD-10-CM

## 2019-10-30 DIAGNOSIS — Z0000 Encounter for general adult medical examination without abnormal findings: Secondary | ICD-10-CM | POA: Diagnosis not present

## 2019-10-30 DIAGNOSIS — L932 Other local lupus erythematosus: Secondary | ICD-10-CM

## 2019-10-30 MED ORDER — DICLOFENAC SODIUM 1 % EX GEL: 4.0000 g | Freq: Four times a day (QID) | CUTANEOUS | 3 refills | Status: DC

## 2019-10-30 NOTE — Patient Instructions (Addendum)
It is imperative that you are seen AT least twice per year for labs and monitoring. Monitor blood pressure at home and me 5-6 reading on separate days. Goal is less than 120/80, based on newest guidelines, however we certainly want to be less than 130/80;  if persistently higher, please make sooner follow up appointment so we can recheck you blood pressure and manage/ adjust medications.  Suspect costochondritis. Please pay attention to symptoms and let me know if any new symptoms or concerns  Trial voltaren gel   Costochondritis Costochondritis is swelling and irritation (inflammation) of the tissue (cartilage) that connects your ribs to your breastbone (sternum). This causes pain in the front of your chest. Usually, the pain:  Starts gradually.  Is in more than one rib. This condition usually goes away on its own over time. Follow these instructions at home:  Do not do anything that makes your pain worse.  If directed, put ice on the painful area: ? Put ice in a plastic bag. ? Place a towel between your skin and the bag. ? Leave the ice on for 20 minutes, 2-3 times a day.  If directed, put heat on the affected area as often as told by your doctor. Use the heat source that your doctor tells you to use, such as a moist heat pack or a heating pad. ? Place a towel between your skin and the heat source. ? Leave the heat on for 20-30 minutes. ? Take off the heat if your skin turns bright red. This is very important if you cannot feel pain, heat, or cold. You may have a greater risk of getting burned.  Take over-the-counter and prescription medicines only as told by your doctor.  Return to your normal activities as told by your doctor. Ask your doctor what activities are safe for you.  Keep all follow-up visits as told by your doctor. This is important. Contact a doctor if:  You have chills or a fever.  Your pain does not go away or it gets worse.  You have a cough that does not go  away. Get help right away if:  You are short of breath. This information is not intended to replace advice given to you by your health care provider. Make sure you discuss any questions you have with your health care provider. Document Revised: 01/16/2017 Document Reviewed: 04/27/2015 Elsevier Patient Education  2020 [place] Maintenance, Female Adopting a healthy lifestyle and getting preventive care are important in promoting health and wellness. Ask your health care provider about:  The right schedule for you to have regular tests and exams.  Things you can do on your own to prevent diseases and keep yourself healthy. What should I know about diet, weight, and exercise? Eat a healthy diet   Eat a diet that includes plenty of vegetables, fruits, low-fat dairy products, and lean protein.  Do not eat a lot of foods that are high in solid fats, added sugars, or sodium. Maintain a healthy weight Body mass index (BMI) is used to identify weight problems. It estimates body fat based on height and weight. Your health care provider can help determine your BMI and help you achieve or maintain a healthy weight. Get regular exercise Get regular exercise. This is one of the most important things you can do for your health. Most adults should:  Exercise for at least 150 minutes each week. The exercise should increase your heart rate and make you sweat (moderate-intensity  exercise).  Do strengthening exercises at least twice a week. This is in addition to the moderate-intensity exercise.  Spend less time sitting. Even light physical activity can be beneficial. Watch cholesterol and blood lipids Have your blood tested for lipids and cholesterol at 42 years of age, then have this test every 5 years. Have your cholesterol levels checked more often if:  Your lipid or cholesterol levels are high.  You are older than 42 years of age.  You are at high risk for heart disease. What  should I know about cancer screening? Depending on your health history and family history, you may need to have cancer screening at various ages. This may include screening for:  Breast cancer.  Cervical cancer.  Colorectal cancer.  Skin cancer.  Lung cancer. What should I know about heart disease, diabetes, and high blood pressure? Blood pressure and heart disease  High blood pressure causes heart disease and increases the risk of stroke. This is more likely to develop in people who have high blood pressure readings, are of African descent, or are overweight.  Have your blood pressure checked: ? Every 3-5 years if you are 10-78 years of age. ? Every year if you are 70 years old or older. Diabetes Have regular diabetes screenings. This checks your fasting blood sugar level. Have the screening done:  Once every three years after age 49 if you are at a normal weight and have a low risk for diabetes.  More often and at a younger age if you are overweight or have a high risk for diabetes. What should I know about preventing infection? Hepatitis B If you have a higher risk for hepatitis B, you should be screened for this virus. Talk with your health care provider to find out if you are at risk for hepatitis B infection. Hepatitis C Testing is recommended for:  Everyone born from [birth year] through 1963.  Anyone with known risk factors for hepatitis C. Sexually transmitted infections (STIs)  Get screened for STIs, including gonorrhea and chlamydia, if: ? You are sexually active and are younger than 42 years of age. ? You are older than 43 years of age and your health care provider tells you that you are at risk for this type of infection. ? Your sexual activity has changed since you were last screened, and you are at increased risk for chlamydia or gonorrhea. Ask your health care provider if you are at risk.  Ask your health care provider about whether you are at high risk for HIV. Your  health care provider may recommend a prescription medicine to help prevent HIV infection. If you choose to take medicine to prevent HIV, you should first get tested for HIV. You should then be tested every 3 months for as long as you are taking the medicine. Pregnancy  If you are about to stop having your period (premenopausal) and you may become pregnant, seek counseling before you get pregnant.  Take 400 to 800 micrograms (mcg) of folic acid every day if you become pregnant.  Ask for birth control (contraception) if you want to prevent pregnancy. Osteoporosis and menopause Osteoporosis is a disease in which the bones lose minerals and strength with aging. This can result in bone fractures. If you are 67 years old or older, or if you are at risk for osteoporosis and fractures, ask your health care provider if you should:  Be screened for bone loss.  Take a calcium or vitamin D supplement to lower your  risk of fractures.  Be given hormone replacement therapy (HRT) to treat symptoms of menopause. Follow these instructions at home: Lifestyle  Do not use any products that contain nicotine or tobacco, such as cigarettes, e-cigarettes, and chewing tobacco. If you need help quitting, ask your health care provider.  Do not use street drugs.  Do not share needles.  Ask your health care provider for help if you need support or information about quitting drugs. Alcohol use  Do not drink alcohol if: ? Your health care provider tells you not to drink. ? You are pregnant, may be pregnant, or are planning to become pregnant.  If you drink alcohol: ? Limit how much you use to 0-1 drink a day. ? Limit intake if you are breastfeeding.  Be aware of how much alcohol is in your drink. In the [place] one drink equals one 12 oz bottle of beer (355 mL), one 5 oz glass of wine (148 mL), or one 1 oz glass of hard liquor (44 mL). General instructions  Schedule regular health, dental, and eye  exams.  Stay current with your vaccines.  Tell your health care provider if: ? You often feel depressed. ? You have ever been abused or do not feel safe at home. Summary  Adopting a healthy lifestyle and getting preventive care are important in promoting health and wellness.  Follow your health care provider's instructions about healthy diet, exercising, and getting tested or screened for diseases.  Follow your health care provider's instructions on monitoring your cholesterol and blood pressure. This information is not intended to replace advice given to you by your health care provider. Make sure you discuss any questions you have with your health care provider. Document Revised: 12/25/2017 Document Reviewed: 12/25/2017 Elsevier Patient Education  2020 [employer].

## 2019-10-30 NOTE — Progress Notes (Signed)
Subjective:    Patient ID: Aimee Medina, female    DOB: 09-10-1975, 44 y.o.   MRN: [id number]  CC: [name] is a 44 y.o. female who presents today for physical exam.    HPI: Left sided chest wall 'soreness' x one month, unchanged.  It doesn't feel like pain and is not excruciating.  No soreness today.  Soreness present if laying prone sleeping,mostly when laying supine and moves to prone to sleep. Soreness feels 'superficial'. No soreness with 'sitting up' NO pain with activity.  No associated numbness or tingling radiating to left arm or jaw, palpitations, dizziness, frequent headaches, changes in vision, or shortness of breath. No sob when leaning forward. No epigastric burning, burping, N, vomiting No  injury. No shoulder pain. No breast pain.     Cutaneous lupus erythematous  Colorectal Cancer Screening: No first degree relatives. MGF has colon cancer. No constipation , rectal bleeding, change in stool shape Breast Cancer Screening: Mammogram due Cervical Cancer Screening: due; follows with Dr Harolyn Rutherford; plans to call and schedule pap smear   Lung Cancer Screening: Doesn't have 30 year pack year history and age > 98 years yo 23 years  No family history of AAA.        Tetanus - utd        Labs: Screening labs today. Exercise: Gets regular exercise.   Alcohol use:  very rare Smoking/tobacco use: Nonsmoker.   Follows with Dr Phillip Heal for psoriasis. No joint pain or swelling.  HISTORY:  Past Medical History:  Diagnosis Date  . Allergy   . Cutaneous lupus erythematosus   . Dermatitis   . Obesity   . Perennial allergic rhinitis   . Psoriasis   . Seasonal and perennial allergic rhinitis 01/21/2015  . Vitamin D deficiency 03/27/2017    Past Surgical History:  Procedure Laterality Date  . ABDOMINOPLASTY  2015  . APPENDECTOMY  2011   Family History  Problem Relation Age of Onset  . High blood pressure Mother   . Heart disease Mother   . Stroke Mother        brain aneursym   . Hypertension Father   . Stroke Father   . Prostate cancer Father 18       prostate- currently in remission  . High Cholesterol Father   . Allergies Daughter   . Allergies Son   . Pancreatic cancer Maternal Grandmother 96  . Colon cancer Maternal Grandfather 43  . Prostate cancer Paternal Grandfather 86  . Breast cancer Neg Hx       ALLERGIES: Patient has no known allergies.  Current Outpatient Medications on File Prior to Visit  Medication Sig Dispense Refill  . cabergoline (DOSTINEX) 0.5 MG tablet Take 0.5 mg by mouth 2 (two) times a week.     No current facility-administered medications on file prior to visit.    Social History   Tobacco Use  . Smoking status: Never Smoker  . Smokeless tobacco: Never Used  Vaping Use  . Vaping Use: Never used  Substance Use Topics  . Alcohol use: No    Alcohol/week: 0.0 standard drinks    Comment: very rare  . Drug use: No    Review of Systems  Constitutional: Negative for chills, fever and unexpected weight change.  HENT: Negative for congestion.   Respiratory: Negative for cough and shortness of breath.   Cardiovascular: Positive for chest pain. Negative for palpitations and leg swelling.  Gastrointestinal: Negative for nausea and vomiting.  Musculoskeletal: Negative for arthralgias and myalgias.  Skin: Negative for rash.  Neurological: Negative for headaches.  Hematological: Negative for adenopathy.  Psychiatric/Behavioral: Negative for confusion.      Objective:    BP 130/86   Pulse 81   Temp 98.1 F (36.7 C)   Ht 5\' 6"  (1.676 m)   Wt 240 lb 1.6 oz (108.9 kg)   LMP 10/22/2019   SpO2 98%   BMI 38.75 kg/m   BP Readings from Last 3 Encounters:  10/30/19 130/86  07/17/18 136/83  07/25/17 125/79   Wt Readings from Last 3 Encounters:  10/30/19 240 lb 1.6 oz (108.9 kg)  03/16/19 234 lb (106.1 kg)  07/17/18 241 lb (109.3 kg)    Physical Exam Vitals reviewed.  Constitutional:      Appearance: She is  well-developed.  Eyes:     Conjunctiva/sclera: Conjunctivae normal.  Neck:     Thyroid: No thyroid mass or thyromegaly.  Cardiovascular:     Rate and Rhythm: Normal rate and regular rhythm.     Pulses: Normal pulses.     Heart sounds: Normal heart sounds.  Pulmonary:     Effort: Pulmonary effort is normal.     Breath sounds: Normal breath sounds. No wheezing, rhonchi or rales.  Chest:     Chest wall: No mass, tenderness or crepitus.     Breasts: Breasts are symmetrical.        Right: No inverted nipple, mass, nipple discharge, skin change or tenderness.        Left: No inverted nipple, mass, nipple discharge, skin change or tenderness.  Musculoskeletal:     Right shoulder: Normal. Normal range of motion. Normal strength.     Left shoulder: Normal. Normal range of motion. Normal strength.  Lymphadenopathy:     Head:     Right side of head: No submental, submandibular, tonsillar, preauricular, posterior auricular or occipital adenopathy.     Left side of head: No submental, submandibular, tonsillar, preauricular, posterior auricular or occipital adenopathy.     Cervical: No cervical adenopathy.     Right cervical: No superficial, deep or posterior cervical adenopathy.    Left cervical: No superficial, deep or posterior cervical adenopathy.  Skin:    General: Skin is warm and dry.  Neurological:     Mental Status: She is alert.  Psychiatric:        Speech: Speech normal.        Behavior: Behavior normal.        Thought Content: Thought content normal.        Assessment & Plan:   Problem List Items Addressed This Visit      Musculoskeletal and Integument   Cutaneous lupus erythematosus    She follows with dermatology. Advised consult with rheumatology to ensure that she doesn't have SLE and require surveillance.       Relevant Orders   Ambulatory referral to Rheumatology     Other   Chest wall pain    Acute. 'Soreness' mostly noted when lying prone when changing from  supine to prone. Etiology is nonspecific as this time and no features to suggest cardiac in nature. Differentials include musculoskeletal including costochondritis. NO features to suggest GERD. Benign CXR and rib series. Advised trial of voltaren gel with close vigilance. Patient will let me know if pain is not self limiting and certainly if worsens or new symptoms develop.       Relevant Medications   diclofenac Sodium (VOLTAREN) 1 % GEL  Other Relevant Orders   DG Ribs Unilateral W/Chest Left (Completed)   Routine physical examination - Primary    CBE performed. Patient will schedule mammogram. Encouraged continued exercise.       Relevant Orders   TSH (Completed)   Comprehensive metabolic panel (Completed)   CBC with Differential/Platelet (Completed)   Hemoglobin A1c (Completed)   Lipid panel (Completed)   VITAMIN D 25 Hydroxy (Vit-D Deficiency, Fractures) (Completed)   MM 3D SCREEN BREAST BILATERAL (Completed)       I have discontinued Captola R. Mcfann's Halog, metroNIDAZOLE, clindamycin, and ergocalciferol. I am also having her maintain her cabergoline and diclofenac Sodium.   Meds ordered this encounter  Medications  . DISCONTD: diclofenac Sodium (VOLTAREN) 1 % GEL    Sig: Apply 4 g topically 4 (four) times daily.    Dispense:  50 g    Refill:  3    Order Specific Question:   Supervising Provider    Answer:   Derrel Nip, TERESA L [[id number]]  . diclofenac Sodium (VOLTAREN) 1 % GEL    Sig: Apply 4 g topically 4 (four) times daily.    Dispense:  50 g    Refill:  3    Order Specific Question:   Supervising Provider    Answer:   Crecencio Mc [[id number]]    Return precautions given.   Risks, benefits, and alternatives of the medications and treatment plan prescribed today were discussed, and patient expressed understanding.   Education regarding symptom management and diagnosis given to patient on AVS.   Continue to follow with Burnard Hawthorne, FNP for routine health maintenance.    Lamona R Gosch and I agreed with plan.   [name], FNP

## 2019-10-31 LAB — CBC WITH DIFFERENTIAL/PLATELET
Absolute Monocytes: 427 cells/uL (ref 200–950)
Basophils Absolute: 21 cells/uL (ref 0–200)
Basophils Relative: 0.3 %
Eosinophils Absolute: 98 cells/uL (ref 15–500)
Eosinophils Relative: 1.4 %
HCT: 40.5 % (ref 35.0–45.0)
Hemoglobin: 13.3 g/dL (ref 11.7–15.5)
Lymphs Abs: 2121 cells/uL (ref 850–3900)
MCH: 29.2 pg (ref 27.0–33.0)
MCHC: 32.8 g/dL (ref 32.0–36.0)
MCV: 89 fL (ref 80.0–100.0)
MPV: 11.5 fL (ref 7.5–12.5)
Monocytes Relative: 6.1 %
Neutro Abs: 4333 cells/uL (ref 1500–7800)
Neutrophils Relative %: 61.9 %
Platelets: 291 10*3/uL (ref 140–400)
RBC: 4.55 10*6/uL (ref 3.80–5.10)
RDW: 12.5 % (ref 11.0–15.0)
Total Lymphocyte: 30.3 %
WBC: 7 10*3/uL (ref 3.8–10.8)

## 2019-10-31 LAB — COMPREHENSIVE METABOLIC PANEL
AG Ratio: 1.6 (calc) (ref 1.0–2.5)
ALT: 12 U/L (ref 6–29)
AST: 14 U/L (ref 10–30)
Albumin: 4.1 g/dL (ref 3.6–5.1)
Alkaline phosphatase (APISO): 68 U/L (ref 31–125)
BUN: 13 mg/dL (ref 7–25)
CO2: 23 mmol/L (ref 20–32)
Calcium: 8.9 mg/dL (ref 8.6–10.2)
Chloride: 103 mmol/L (ref 98–110)
Creat: 0.86 mg/dL (ref 0.50–1.10)
Globulin: 2.6 g/dL (calc) (ref 1.9–3.7)
Glucose, Bld: 79 mg/dL (ref 65–99)
Potassium: 3.9 mmol/L (ref 3.5–5.3)
Sodium: 137 mmol/L (ref 135–146)
Total Bilirubin: 0.4 mg/dL (ref 0.2–1.2)
Total Protein: 6.7 g/dL (ref 6.1–8.1)

## 2019-10-31 LAB — LIPID PANEL
Cholesterol: 175 mg/dL (ref <200)
HDL: 50 mg/dL (ref >=50)
LDL Cholesterol (Calc): 107 mg/dL (calc) — ABNORMAL HIGH
Non-HDL Cholesterol (Calc): 125 mg/dL (calc) (ref <130)
Total CHOL/HDL Ratio: 3.5 (calc) (ref <5.0)
Triglycerides: 85 mg/dL (ref <150)

## 2019-10-31 LAB — HEMOGLOBIN A1C
Hgb A1c MFr Bld: 5.3 % of total Hgb (ref <5.7)
Mean Plasma Glucose: 105 (calc)
eAG (mmol/L): 5.8 (calc)

## 2019-10-31 LAB — VITAMIN D 25 HYDROXY (VIT D DEFICIENCY, FRACTURES): Vit D, 25-Hydroxy: 33 ng/mL (ref 30–100)

## 2019-10-31 LAB — TSH: TSH: 2.47 mIU/L

## 2019-11-02 ENCOUNTER — Other Ambulatory Visit: Payer: Self-pay

## 2019-11-02 ENCOUNTER — Encounter: Payer: Self-pay | Admitting: Family

## 2019-11-02 ENCOUNTER — Ambulatory Visit
Admission: RE | Admit: 2019-11-02 | Discharge: 2019-11-02 | Disposition: A | Discharge status: Home / Self Care | Payer: No Typology Code available for payment source | Source: Ambulatory Visit | Attending: Family | Admitting: Family

## 2019-11-02 DIAGNOSIS — Z0000 Encounter for general adult medical examination without abnormal findings: Secondary | ICD-10-CM

## 2019-11-04 ENCOUNTER — Other Ambulatory Visit: Payer: Self-pay | Admitting: Family

## 2019-11-04 DIAGNOSIS — N6310 Unspecified lump in the right breast, unspecified quadrant: Secondary | ICD-10-CM

## 2019-11-06 NOTE — Assessment & Plan Note (Addendum)
CBE performed. Patient will schedule mammogram. Encouraged continued exercise.

## 2019-11-06 NOTE — Assessment & Plan Note (Signed)
She follows with dermatology. Advised consult with rheumatology to ensure that she doesn't have SLE and require surveillance.

## 2019-11-06 NOTE — Assessment & Plan Note (Addendum)
Acute. 'Soreness' mostly noted when lying prone when changing from supine to prone. Etiology is nonspecific as this time and no features to suggest cardiac in nature. Differentials include musculoskeletal including costochondritis. NO features to suggest GERD. Benign CXR and rib series. Advised trial of voltaren gel with close vigilance. Patient will let me know if pain is not self limiting and certainly if worsens or new symptoms develop.

## 2019-11-20 ENCOUNTER — Other Ambulatory Visit: Payer: Self-pay

## 2019-11-20 ENCOUNTER — Ambulatory Visit
Admission: RE | Admit: 2019-11-20 | Discharge: 2019-11-20 | Disposition: A | Discharge status: Home / Self Care | Payer: No Typology Code available for payment source | Source: Ambulatory Visit | Attending: Family | Admitting: Family

## 2019-11-20 DIAGNOSIS — N6310 Unspecified lump in the right breast, unspecified quadrant: Secondary | ICD-10-CM

## 2019-11-27 ENCOUNTER — Encounter: Payer: Self-pay | Admitting: Family

## 2019-11-27 DIAGNOSIS — R768 Other specified abnormal immunological findings in serum: Secondary | ICD-10-CM

## 2019-11-27 HISTORY — DX: Other specified abnormal immunological findings in serum: R76.8

## 2019-12-04 ENCOUNTER — Ambulatory Visit (INDEPENDENT_AMBULATORY_CARE_PROVIDER_SITE_OTHER): Payer: No Typology Code available for payment source

## 2019-12-04 ENCOUNTER — Other Ambulatory Visit: Payer: Self-pay

## 2019-12-04 ENCOUNTER — Ambulatory Visit (INDEPENDENT_AMBULATORY_CARE_PROVIDER_SITE_OTHER): Payer: No Typology Code available for payment source | Admitting: Family

## 2019-12-04 ENCOUNTER — Encounter: Payer: Self-pay | Admitting: Family

## 2019-12-04 VITALS — BP 130/82 | HR 83 | Temp 98.2°F | Ht 66.0 in | Wt 238.8 lb

## 2019-12-04 DIAGNOSIS — M542 Cervicalgia: Secondary | ICD-10-CM | POA: Diagnosis not present

## 2019-12-04 DIAGNOSIS — R768 Other specified abnormal immunological findings in serum: Secondary | ICD-10-CM | POA: Diagnosis not present

## 2019-12-04 DIAGNOSIS — R0789 Other chest pain: Secondary | ICD-10-CM

## 2019-12-04 NOTE — Patient Instructions (Signed)
Referral to physical therapy  Please keep monitoring your chest wall pain.Try sports bra. Please let me know of any concerns.     ( note patient would like to view AVS on Mychart and I have not printed)

## 2019-12-04 NOTE — Progress Notes (Signed)
Subjective:    Patient ID: Aimee Medina, female    DOB: Jul 20, 1975, 44 y.o.   MRN: [id number]  CC: Aimee Medina is a 44 y.o. female who presents today for follow up.   HPI: Complains of posterior neck pain, for past several months, unchanged. Comes and goes. 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   No ha, vision changes, arm weakness or arm numbness.  Continues to have left sided chest wall pain x 3 months, unchanged.  She notices it is worse when lies on right side and left breast falls towards right side. She has tried different bras without relief. No CP with exertion, sob.  No rash.     HISTORY:  Past Medical History:  Diagnosis Date  . Allergy   . Cutaneous lupus erythematosus   . Dermatitis   . Obesity   . Perennial allergic rhinitis   . Positive ANA (antinuclear antibody) 11/27/2019   Consult with dr patel rheumatology 11/21 and felt false positive ana.   . Psoriasis   . Seasonal and perennial allergic rhinitis 01/21/2015  . Vitamin D deficiency 03/27/2017   Past Surgical History:  Procedure Laterality Date  . ABDOMINOPLASTY  2015  . APPENDECTOMY  2011   Family History  Problem Relation Age of Onset  . High blood pressure Mother   . Heart disease Mother   . Stroke Mother        brain aneursym  . Hypertension Father   . Stroke Father   . Prostate cancer Father 64       prostate- currently in remission  . High Cholesterol Father   . Allergies Daughter   . Allergies Son   . Pancreatic cancer Maternal Grandmother 46  . Colon cancer Maternal Grandfather 88  . Prostate cancer Paternal Grandfather 18  . Breast cancer Neg Hx     Allergies: Patient has no known allergies. Current Outpatient Medications on File Prior to Visit  Medication Sig Dispense Refill  . cabergoline (DOSTINEX) 0.5 MG tablet Take 0.5 mg by mouth 2 (two) times a week.    . diclofenac Sodium (VOLTAREN) 1 % GEL Apply 4 g topically 4 (four) times daily. 50 g 3   No  current facility-administered medications on file prior to visit.    Social History   Tobacco Use  . Smoking status: Never Smoker  . Smokeless tobacco: Never Used  Vaping Use  . Vaping Use: Never used  Substance Use Topics  . Alcohol use: No    Alcohol/week: 0.0 standard drinks    Comment: very rare  . Drug use: No    Review of Systems  Constitutional: Negative for chills and fever.  Eyes: Negative for visual disturbance.  Respiratory: Negative for cough, chest tightness and shortness of breath.   Cardiovascular: Negative for chest pain and palpitations.  Gastrointestinal: Negative for nausea and vomiting.  Musculoskeletal: Positive for neck pain. Negative for neck stiffness.  Neurological: Negative for dizziness and headaches.      Objective:    BP 130/82   Pulse 83   Temp 98.2 F (36.8 C)   Ht 5\' 6"  (1.676 m)   Wt 238 lb 12.8 oz (108.3 kg)   SpO2 98%   BMI 38.54 kg/m  BP Readings from Last 3 Encounters:  12/04/19 130/82  10/30/19 130/86  07/17/18 136/83   Wt Readings from Last 3 Encounters:  12/04/19 238 lb 12.8 oz (108.3 kg)  10/30/19 240 lb 1.6  oz (108.9 kg)  03/16/19 234 lb (106.1 kg)    Physical Exam Vitals reviewed.  Constitutional:      Appearance: She is well-developed.  Eyes:     Conjunctiva/sclera: Conjunctivae normal.  Cardiovascular:     Rate and Rhythm: Normal rate and regular rhythm.     Pulses: Normal pulses.     Heart sounds: Normal heart sounds.  Pulmonary:     Effort: Pulmonary effort is normal.     Breath sounds: Normal breath sounds. No wheezing, rhonchi or rales.  Chest:     Chest wall: No tenderness.  Musculoskeletal:     Cervical back: Normal range of motion. No torticollis. No pain with movement, spinous process tenderness or muscular tenderness. Normal range of motion.  Skin:    General: Skin is warm and dry.  Neurological:     Mental Status: She is alert.  Psychiatric:        Speech: Speech normal.        Behavior:  Behavior normal.        Thought Content: Thought content normal.        Assessment & Plan:   Problem List Items Addressed This Visit      Other   Chest wall pain    Unchanged. No features to suggest cardiac etiology. Advised to wear sports bra , or two, at night so see if weight of breast contributory. Close follow up.      Neck pain - Primary    Chronic. XR cervical shows mild reversal of the normal cervical lordosis and early narrowing of interspaces C4-5, C5-6. We are pursing physical therapy. Close follow up.       Relevant Orders   Ambulatory referral to Physical Therapy   DG Cervical Spine Complete (Completed)   RESOLVED: Positive ANA (antinuclear antibody)       I am having Aimee Medina maintain her cabergoline and diclofenac Sodium.   No orders of the defined types were placed in this encounter.   Return precautions given.   Risks, benefits, and alternatives of the medications and treatment plan prescribed today were discussed, and patient expressed understanding.   Education regarding symptom management and diagnosis given to patient on AVS.  Continue to follow with Aimee Hawthorne, FNP for routine health maintenance.   Aimee Medina and I agreed with plan.   Aimee Paris, FNP

## 2019-12-08 NOTE — Assessment & Plan Note (Signed)
Chronic. XR cervical shows mild reversal of the normal cervical lordosis and early narrowing of interspaces C4-5, C5-6. We are pursing physical therapy. Close follow up.

## 2019-12-08 NOTE — Assessment & Plan Note (Signed)
Unchanged. No features to suggest cardiac etiology. Advised to wear sports bra , or two, at night so see if weight of breast contributory. Close follow up.

## 2019-12-25 ENCOUNTER — Ambulatory Visit: Payer: No Typology Code available for payment source | Attending: Family

## 2019-12-25 ENCOUNTER — Other Ambulatory Visit: Payer: Self-pay

## 2019-12-25 DIAGNOSIS — M62838 Other muscle spasm: Secondary | ICD-10-CM | POA: Diagnosis present

## 2019-12-25 DIAGNOSIS — M436 Torticollis: Secondary | ICD-10-CM | POA: Insufficient documentation

## 2019-12-25 DIAGNOSIS — M542 Cervicalgia: Secondary | ICD-10-CM | POA: Diagnosis not present

## 2019-12-26 NOTE — Therapy (Addendum)
[place], [place], [address] Phone: [phone]   Fax:  [phone]  Physical Therapy Evaluation / discharge  Patient Details  Name: Aimee Medina MRN: [id number] Date of Birth: [birth year] Referring Provider (PT): Burnard Hawthorne, FNP   Encounter Date: 12/25/2019   PT End of Session - 12/26/19 1016    Visit Number 1    Number of Visits 7    Date for PT Re-Evaluation 02/13/20    Authorization Type [place] [employer]    PT Start Time 0917    PT Stop Time 1004    PT Time Calculation (min) 47 min    Activity Tolerance Patient tolerated treatment well    Behavior During Therapy [facility] for tasks assessed/performed           Past Medical History:  Diagnosis Date  . Allergy   . Cutaneous lupus erythematosus   . Dermatitis   . Obesity   . Perennial allergic rhinitis   . Positive ANA (antinuclear antibody) 11/27/2019   Consult with dr patel rheumatology 11/21 and felt false positive ana.   . Psoriasis   . Seasonal and perennial allergic rhinitis 01/21/2015  . Vitamin D deficiency 03/27/2017    Past Surgical History:  Procedure Laterality Date  . ABDOMINOPLASTY  2015  . APPENDECTOMY  2011    There were no vitals filed for this visit.        [facility] PT Assessment - 12/26/19 0001      Assessment   Medical Diagnosis Neck pain    Referring Provider (PT) Arnett, Yvetta Coder, FNP    Onset Date/Surgical Date --   over a year   Hand Dominance Right    Prior Therapy Chirpractor   somewhat helpful     Precautions   Precautions None      Restrictions   Weight Bearing Restrictions No      Balance Screen   Has the patient fallen in the past 6 months No      [place] residence    Living Arrangements Spouse/significant other;Children    Type of [place] to enter    Entrance Stairs-Number of Steps 0    Home Layout Two level    Alternate  Level Stairs-Number of Steps 14    Alternate Level Stairs-Rails Right      Prior Function   Level of Independence Independent    Vocation Full time employment    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      Cognition   Overall Cognitive Status Within Functional Limits for tasks assessed      Observation/Other Assessments   Focus on Therapeutic Outcomes (FOTO)  72% functional ability      Sensation   Light Touch Appears Intact      Posture/Postural Control   Posture/Postural Control Postural limitations    Postural Limitations Forward head;Rounded Shoulders      ROM / Strength   AROM / PROM / Strength AROM;Strength      AROM   AROM Assessment Site Cervical    Cervical Flexion 61    Cervical Extension 52    Cervical - Right Side Bend 30    Cervical - Left Side Bend 30    Cervical - Right Rotation 56    Cervical - Left Rotation 52      Strength   Overall Strength Comments UE myotomal screeen was negative  Palpation   Palpation comment TTP to L upper trap medially c increased muscle tightness      Special Tests    Special Tests Cervical    Cervical Tests Spurling's;Dictraction      Spurling's   Findings Negative    Side Right   Lt     Distraction Test   Findngs Positive    Comment relief of cervical pain      Transfers   Transfers Sit to Stand;Stand to Sit    Sit to Stand 7: Independent      Ambulation/Gait   Ambulation/Gait Yes    Ambulation/Gait Assistance 7: Independent    Gait Pattern Step-through pattern                      Objective measurements completed on examination: See above findings.               PT Education - 12/26/19 1013    Education Details Eval finding. POC, HEP, proper posture and proper computer station set up, use of a theracane    Person(s) Educated Patient    Methods Explanation;Demonstration;Tactile cues;Verbal cues;Handout    Comprehension Verbalized understanding;Returned  demonstration;Verbal cues required;Tactile cues required;Need further instruction            PT Short Term Goals - 12/26/19 2241      PT SHORT TERM GOAL #1   Title Pt will be Ind in an initial HEP    Status New    Target Date 01/16/20      PT SHORT TERM GOAL #2   Title Pt will voice understanding of measures to assist in the reduction of cervical/upper shoulder pain    Status New    Target Date 01/16/20             PT Long Term Goals - 12/26/19 2245      PT LONG TERM GOAL #1   Title Pt will be Inin a final HEP to progress or maintain achieved LOF    Status New    Target Date 02/13/20      PT LONG TERM GOAL #2   Title Pt will report improved neck and upper shoulder pain to 4/10 or less with daily activites including work    Baseline 2-7/10    Status New    Target Date 02/13/20      PT LONG TERM GOAL #3   Title Pt's cervical mobility will improve 5d for L and R rotation and sidebending for improved cerviccal mobility and function    Status New    Target Date 02/13/20      PT LONG TERM GOAL #4   Title Pt's FOTO score will improve to the predicted value of 76% functional ability    Baseline 72% functional ability    Status New    Target Date 02/13/20                  Plan - 12/26/19 1018    Clinical Impression Statement Pt presents to PT c cervical/upper shoulder pain which is wrosned by work and driving. 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 Cervical ROMs are min. decreased c the motions to the L more so than R. Pt is TTP to the L upper trap with increased muscle tension. Pt will benefit from PT 1w6 for education re: proper posture and proper computer station set up, cervical ROM/flexibility, posterior  chain strengthening, manual therapy, and modalities to reduce pain and improve cervical function.    Personal Factors and Comorbidities Profession;Comorbidity 3+    Comorbidities obesity, Early  degenerative disc changes C4-6, positive ANA    Examination-Activity Limitations Reach Overhead    Examination-Participation Restrictions Occupation    Stability/Clinical Decision Making Stable/Uncomplicated    Clinical Decision Making Low    Rehab Potential Excellent    PT Frequency 1x / week    PT Duration 6 weeks    PT Treatment/Interventions ADLs/Self Care Home Management;Electrical Stimulation;Ultrasound;Traction;Moist Heat;Iontophoresis 4mg /ml Dexamethasone;Therapeutic activities;Therapeutic exercise;Manual techniques;Patient/family education;Dry needling;Passive range of motion;Joint Manipulations;Spinal Manipulations;Taping    PT Next Visit Plan Assess response to HEP and Ed for proper posture/computer set up    [place] and Agree with Plan of Care Patient           Patient will benefit from skilled therapeutic intervention in order to improve the following deficits and impairments:  Decreased range of motion,Increased muscle spasms,Obesity,Pain,Postural dysfunction  Visit Diagnosis: Neck pain  Muscle spasms of neck  Stiffness of neck     Problem List Patient Active Problem List   Diagnosis Date Noted  . Neck pain 12/04/2019  . Chest wall pain 10/30/2019  . Elevated prolactin level 07/19/2018  . ASCUS of cervix with negative high risk HPV on 07/17/2018 07/17/2018  . Obesity, Class II, BMI 35-39.9, isolated 01/21/2015  . Routine physical examination 01/21/2015  . Cutaneous lupus erythematosus 01/21/2015    Gar Ponto MS, PT 12/26/19 11:04 PM  [facility] [address] [place], [place], [address] Phone: [phone]   Fax:  [phone]  Name: Aimee Medina MRN: [id number] Date of Birth: September 28, 1975       Pt discharged due to not returning since evaluation [place] PT, DPT, LAT, ATC  03/10/20  9:52 AM

## 2020-01-06 ENCOUNTER — Ambulatory Visit: Payer: No Typology Code available for payment source | Admitting: Rehabilitative and Restorative Service Providers"

## 2020-01-12 ENCOUNTER — Ambulatory Visit (INDEPENDENT_AMBULATORY_CARE_PROVIDER_SITE_OTHER): Payer: No Typology Code available for payment source | Admitting: Obstetrics & Gynecology

## 2020-01-12 ENCOUNTER — Encounter: Payer: Self-pay | Admitting: Obstetrics & Gynecology

## 2020-01-12 ENCOUNTER — Other Ambulatory Visit: Payer: Self-pay

## 2020-01-12 ENCOUNTER — Ambulatory Visit: Payer: No Typology Code available for payment source | Admitting: Rehabilitative and Restorative Service Providers"

## 2020-01-12 ENCOUNTER — Other Ambulatory Visit (HOSPITAL_COMMUNITY)
Admission: RE | Admit: 2020-01-12 | Discharge: 2020-01-12 | Disposition: A | Discharge status: Home / Self Care | Payer: No Typology Code available for payment source | Source: Ambulatory Visit | Attending: Obstetrics & Gynecology | Admitting: Obstetrics & Gynecology

## 2020-01-12 VITALS — BP 127/83 | HR 78 | Ht 67.0 in | Wt 241.0 lb

## 2020-01-12 DIAGNOSIS — R87610 Atypical squamous cells of undetermined significance on cytologic smear of cervix (ASC-US): Secondary | ICD-10-CM

## 2020-01-12 DIAGNOSIS — Z01419 Encounter for gynecological examination (general) (routine) without abnormal findings: Secondary | ICD-10-CM | POA: Insufficient documentation

## 2020-01-12 NOTE — Patient Instructions (Signed)

## 2020-01-12 NOTE — Progress Notes (Signed)
GYNECOLOGY ANNUAL PREVENTATIVE CARE ENCOUNTER NOTE  History:     Aimee Medina is a 44 y.o. [phone] female here for a routine annual gynecologic exam.  Current complaints: none.   Denies abnormal vaginal bleeding, discharge, pelvic pain, problems with intercourse or other gynecologic concerns.    Gynecologic History Patient's last menstrual period was 12/19/2019 (exact date). Contraception: none Last Pap: 07/17/2018. Results were: normal with negative HPV Last mammogram: 11/02/19. Results were: normal  Obstetric History OB History  Gravida Para Term Preterm AB Living  4 2 2  0 2 2  SAB IAB Ectopic Multiple Live Births  1 1 0 0 2    # Outcome Date GA Lbr Len/2nd Weight Sex Delivery Anes PTL Lv  4 IAB      TAB     3 SAB      SAB     2 Term      Vag-Spont   LIV  1 Term      Vag-Spont   LIV    Past Medical History:  Diagnosis Date  . Allergy   . Dermatitis   . Obesity   . Perennial allergic rhinitis   . Positive ANA (antinuclear antibody) 11/27/2019   Consult with dr patel rheumatology 11/21 and felt false positive ana.   . Psoriasis   . Seasonal and perennial allergic rhinitis 01/21/2015  . Vitamin D deficiency 03/27/2017    Past Surgical History:  Procedure Laterality Date  . ABDOMINOPLASTY  2015  . APPENDECTOMY  2011    Current Outpatient Medications on File Prior to Visit  Medication Sig Dispense Refill  . cabergoline (DOSTINEX) 0.5 MG tablet Take 0.5 mg by mouth 2 (two) times a week.    . diclofenac Sodium (VOLTAREN) 1 % GEL Apply 4 g topically 4 (four) times daily. 50 g 3   No current facility-administered medications on file prior to visit.    No Known Allergies  Social History:  reports that she has never smoked. She has never used smokeless tobacco. She reports that she does not drink alcohol and does not use drugs.  Family History  Problem Relation Age of Onset  . High blood pressure Mother   . Heart disease Mother   . Stroke Mother        brain  aneursym  . Hypertension Father   . Stroke Father   . Prostate cancer Father 65       prostate- currently in remission  . High Cholesterol Father   . Allergies Daughter   . Allergies Son   . Pancreatic cancer Maternal Grandmother 40  . Colon cancer Maternal Grandfather 30  . Prostate cancer Paternal Grandfather 36  . Breast cancer Neg Hx     The following portions of the patient's history were reviewed and updated as appropriate: allergies, current medications, past family history, past medical history, past social history, past surgical history and problem list.  Review of Systems Pertinent items noted in HPI and remainder of comprehensive ROS otherwise negative.  Physical Exam:  BP 127/83   Pulse 78   Ht 5\' 7"  (1.702 m)   Wt 241 lb (109.3 kg)   LMP 12/19/2019 (Exact Date)   BMI 37.75 kg/m  CONSTITUTIONAL: Well-developed, well-nourished female in no acute distress.  HENT:  Normocephalic, atraumatic, External right and left ear normal.  EYES: Conjunctivae and EOM are normal. Pupils are equal, round, and reactive to light. No scleral icterus.  NECK: Normal range of motion, supple, no masses.  Normal thyroid.  SKIN: Skin is warm and dry. No rash noted. Not diaphoretic. No erythema. No pallor. MUSCULOSKELETAL: Normal range of motion. No tenderness.  No cyanosis, clubbing, or edema.   NEUROLOGIC: Alert and oriented to person, place, and time. Normal reflexes, muscle tone coordination.  PSYCHIATRIC: Normal mood and affect. Normal behavior. Normal judgment and thought content. CARDIOVASCULAR: Normal heart rate noted, regular rhythm RESPIRATORY: Clear to auscultation bilaterally. Effort and breath sounds normal, no problems with respiration noted. BREASTS: Symmetric in size. No masses, tenderness, skin changes, nipple drainage, or lymphadenopathy bilaterally. Performed in the presence of a chaperone. ABDOMEN: Soft, no distention noted.  No tenderness, rebound or guarding.  PELVIC:  Normal appearing external genitalia and urethral meatus; normal appearing vaginal mucosa and cervix.  No abnormal discharge noted.  Pap smear obtained, scant bleeding aftre pap.  Normal uterine size, no other palpable masses, no uterine or adnexal tenderness.  Performed in the presence of a chaperone.   Assessment and Plan:    1. ASCUS of cervix with negative high risk HPV on 07/17/2018 Repeat pap done today, will follow up results and manage accordingly. - Cytology - PAP  2. Well woman exam with routine gynecological exam Desires comprehensive annual STI screen - Cytology - PAP - Hepatitis B surface antigen; Future - Hepatitis C antibody; Future - HIV Antibody (routine testing w rflx); Future - RPR; Future  Will follow up results manage accordingly. Mammogram is up to date. Routine preventative health maintenance measures emphasized. Please refer to After Visit Summary for other counseling recommendations.      Jaynie Collins, MD, FACOG Obstetrician & Gynecologist, [facility] for [employer], [facility]

## 2020-01-17 LAB — CYTOLOGY - PAP
Chlamydia: NEGATIVE
Comment: NEGATIVE
Comment: NEGATIVE
Comment: NEGATIVE
Comment: NORMAL
Diagnosis: UNDETERMINED — AB
High risk HPV: NEGATIVE
Neisseria Gonorrhea: NEGATIVE
Trichomonas: NEGATIVE

## 2020-01-19 ENCOUNTER — Ambulatory Visit: Payer: No Typology Code available for payment source | Attending: Family

## 2020-01-19 ENCOUNTER — Telehealth: Payer: Self-pay

## 2020-01-19 NOTE — Telephone Encounter (Signed)
LVM re; no show appt. Reminded pt of attendance policy and and upcoming appt for 01/26/20 at 5pm.

## 2020-01-25 ENCOUNTER — Telehealth: Payer: Self-pay | Admitting: *Deleted

## 2020-01-25 NOTE — Telephone Encounter (Signed)
Called pt to discuss pap results. Pt denies any issues, and explained we would only treat for yeast if she becomes symptomatic. Pt verbalizes and understands.

## 2020-01-26 ENCOUNTER — Ambulatory Visit: Payer: No Typology Code available for payment source

## 2020-02-09 ENCOUNTER — Ambulatory Visit: Payer: No Typology Code available for payment source

## 2020-02-12 ENCOUNTER — Telehealth: Payer: Self-pay

## 2020-02-12 NOTE — Telephone Encounter (Signed)
LVM re: no show appt on 02/09/20, Pt was advised she has no further appts scheduled, but she can call in to schedule if she would like to continue with PT. Otherwise if no contact is received in the next 2 weeks, she will be DCed from PT services.

## 2020-03-01 ENCOUNTER — Other Ambulatory Visit: Payer: Self-pay | Admitting: Internal Medicine

## 2020-10-28 ENCOUNTER — Ambulatory Visit: Payer: No Typology Code available for payment source | Attending: Internal Medicine

## 2020-10-28 ENCOUNTER — Other Ambulatory Visit: Payer: Self-pay

## 2020-10-28 DIAGNOSIS — Z23 Encounter for immunization: Secondary | ICD-10-CM

## 2020-10-28 MED ORDER — PFIZER COVID-19 VAC BIVALENT 30 MCG/0.3ML IM SUSP
INTRAMUSCULAR | 0 refills | Status: DC
  Filled 2020-10-28: qty 0.3, 1d supply, fill #0

## 2020-10-28 NOTE — Progress Notes (Signed)
   Covid-19 Vaccination Clinic  Name:  Aimee Medina    MRN: [id number] DOB: 1975-12-02  10/28/2020  Ms. Aimee Medina was observed post Covid-19 immunization for 15 minutes without incident. She was provided with Vaccine Information Sheet and instruction to access the V-Safe system.   Ms. Aimee Medina was instructed to call 911 with any severe reactions post vaccine: Difficulty breathing  Swelling of face and throat  A fast heartbeat  A bad rash all over body  Dizziness and weakness   Lu Duffel, PharmD, MBA Clinical Acute Care Pharmacist

## 2020-10-31 ENCOUNTER — Ambulatory Visit (INDEPENDENT_AMBULATORY_CARE_PROVIDER_SITE_OTHER): Payer: No Typology Code available for payment source | Admitting: Family

## 2020-10-31 ENCOUNTER — Encounter: Payer: Self-pay | Admitting: Family

## 2020-10-31 ENCOUNTER — Other Ambulatory Visit: Payer: Self-pay

## 2020-10-31 VITALS — BP 118/80 | HR 83 | Temp 98.4°F | Ht 67.0 in | Wt 248.0 lb

## 2020-10-31 DIAGNOSIS — E669 Obesity, unspecified: Secondary | ICD-10-CM | POA: Diagnosis not present

## 2020-10-31 DIAGNOSIS — R42 Dizziness and giddiness: Secondary | ICD-10-CM

## 2020-10-31 MED ORDER — TIRZEPATIDE 2.5 MG/0.5ML ~~LOC~~ SOAJ
2.5000 mg | SUBCUTANEOUS | 1 refills | Status: DC
  Filled 2020-10-31: qty 2, 28d supply, fill #0

## 2020-10-31 NOTE — Progress Notes (Signed)
Subjective:    Patient ID: Aimee Medina, female    DOB: 12-10-1975, 45 y.o.   MRN: [id number]  CC: Aimee Medina is a 45 y.o. female who presents today for follow up.   HPI: Complains of room spinning x 3 weeks if laying in bed, resolved over the past 3 days.  Last for 30 seconds and if rolls to over side.  No HA,  cp, sob, nausea, vision changes, syncope, palpitations, sinus congestion, hearing loss, ringing in the ears.   Hasnt seen ENT.   Told she had vertigo in the past.   She hasnt tried medication.   Recently started to drink coffee.       She in interested in ozempic. No personal or family h/o medullary cancer, or MEN.  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  She is compliant with cabergoline.  Follows with GYN, Dr Harolyn Rutherford  HISTORY:  Past Medical History:  Diagnosis Date   Allergy    Dermatitis    Obesity    Perennial allergic rhinitis    Positive ANA (antinuclear antibody) 11/27/2019   Consult with dr patel rheumatology 11/21 and felt false positive ana.    Psoriasis    Seasonal and perennial allergic rhinitis 01/21/2015   Vitamin D deficiency 03/27/2017   Past Surgical History:  Procedure Laterality Date   ABDOMINOPLASTY  2015   APPENDECTOMY  2011   Family History  Problem Relation Age of Onset   High blood pressure Mother    Heart disease Mother    Stroke Mother        brain aneursym   Hypertension Father    Stroke Father    Prostate cancer Father 41       prostate- currently in remission   High Cholesterol Father    Pancreatic cancer Maternal Grandmother 61   Colon cancer Maternal Grandfather 69   Prostate cancer Paternal Grandfather 62   Allergies Daughter    Allergies Son    Breast cancer Neg Hx    Thyroid cancer Neg Hx     Allergies: Patient has no known allergies. Current Outpatient Medications on File Prior to  Visit  Medication Sig Dispense Refill   cabergoline (DOSTINEX) 0.5 MG tablet Take 0.5 mg by mouth 2 (two) times a week.     No current facility-administered medications on file prior to visit.    Social History   Tobacco Use   Smoking status: Never   Smokeless tobacco: Never  Vaping Use   Vaping Use: Never used  Substance Use Topics   Alcohol use: No    Alcohol/week: 0.0 standard drinks    Comment: very rare   Drug use: No    Review of Systems  Constitutional:  Negative for chills and fever.  HENT:  Negative for trouble swallowing and voice change.   Eyes:  Negative for visual disturbance.  Respiratory:  Negative for cough.   Cardiovascular:  Negative for chest pain and palpitations.  Gastrointestinal:  Negative for nausea and vomiting.  Neurological:  Negative for dizziness, syncope, numbness and headaches.     Objective:    BP 118/80 (BP Location: Left Arm, Patient Position: Sitting, Cuff Size: Large)   Pulse 83   Temp 98.4 F (36.9 C) (Oral)   Ht 5\' 7"  (1.702 m)   Wt 248 lb (112.5 kg)   SpO2 99%   BMI 38.84  kg/m  BP Readings from Last 3 Encounters:  10/31/20 118/80  01/12/20 127/83  12/04/19 130/82   Wt Readings from Last 3 Encounters:  10/31/20 248 lb (112.5 kg)  01/12/20 241 lb (109.3 kg)  12/04/19 238 lb 12.8 oz (108.3 kg)    Physical Exam Vitals reviewed.  Constitutional:      Appearance: She is well-developed.  HENT:     Head: Normocephalic and atraumatic.     Right Ear: Hearing, tympanic membrane, ear canal and external ear normal. No swelling or tenderness. No middle ear effusion. Tympanic membrane is not erythematous or bulging.     Left Ear: Tympanic membrane, ear canal and external ear normal. No swelling or tenderness.  No middle ear effusion. Tympanic membrane is not erythematous or bulging.     Nose: Nose normal. No rhinorrhea.     Right Sinus: No maxillary sinus tenderness or frontal sinus tenderness.     Left Sinus: No maxillary sinus  tenderness or frontal sinus tenderness.     Mouth/Throat:     Pharynx: Uvula midline. No posterior oropharyngeal erythema.  Eyes:     General: Lids are normal. Lids are everted, no foreign bodies appreciated.     Conjunctiva/sclera: Conjunctivae normal.     Pupils: Pupils are equal, round, and reactive to light.     Comments: Normal fundus bilaterally   Neck:     Thyroid: No thyroid mass or thyromegaly.  Cardiovascular:     Rate and Rhythm: Normal rate and regular rhythm.     Pulses: Normal pulses.     Heart sounds: Normal heart sounds.  Pulmonary:     Effort: Pulmonary effort is normal.     Breath sounds: Normal breath sounds. No wheezing, rhonchi or rales.  Lymphadenopathy:     Head:     Right side of head: No submental, submandibular, tonsillar, preauricular, posterior auricular or occipital adenopathy.     Left side of head: No submental, submandibular, tonsillar, preauricular, posterior auricular or occipital adenopathy.     Cervical: No cervical adenopathy.     Right cervical: No superficial, deep or posterior cervical adenopathy.    Left cervical: No superficial, deep or posterior cervical adenopathy.  Skin:    General: Skin is warm and dry.  Neurological:     Mental Status: She is alert.     Cranial Nerves: No cranial nerve deficit.     Sensory: No sensory deficit.     Deep Tendon Reflexes:     Reflex Scores:      Bicep reflexes are 2+ on the right side and 2+ on the left side.      Patellar reflexes are 2+ on the right side and 2+ on the left side.    Comments: Grip equal and strong bilateral upper extremities. Gait strong and steady. Able to perform  finger-to-nose without difficulty.  Dix hall pike maneuver failed to elicit vertigo. No nystagmus noted.    Psychiatric:        Speech: Speech normal.        Behavior: Behavior normal.        Thought Content: Thought content normal.       Assessment & Plan:   Problem List Items Addressed This Visit       Other    Obesity, Class II, BMI 35-39.9, isolated - Primary    We discussed mounjaro.  Discussed black box warning as it relates to medullary thyroid cancer, MEN. Close follow up.  Relevant Medications   tirzepatide (MOUNJARO) 2.5 MG/0.5ML Pen   Other Relevant Orders   TSH   Comprehensive metabolic panel   CBC with Differential/Platelet   Hemoglobin A1c   Vertigo    Resolved. Benign neurologic exam.  Unable to elicit nystagmus, vertigo on exam.  She is not orthostatic.  See flowsheet .symptom has resolved as of today.  Description of symptoms most consistent with vertigo. advised her if it were to recur certainly become more frequent to let me know immediately so we can pursue neuroimaging.  Referral to ENT to be proactive as it relates to vestibular rehab, ENT evaluation if vertigo were to recur.      Relevant Orders   Ambulatory referral to ENT     I have discontinued Micah R. Maeda's Comptroller. I am also having her start on tirzepatide. Additionally, I am having her maintain her cabergoline.   Meds ordered this encounter  Medications   tirzepatide (MOUNJARO) 2.5 MG/0.5ML Pen    Sig: Inject 2.5 mg into the skin once a week.    Dispense:  2 mL    Refill:  1    Order Specific Question:   Supervising Provider    Answer:   Crecencio Mc [[id number]]     Return precautions given.   Risks, benefits, and alternatives of the medications and treatment plan prescribed today were discussed, and patient expressed understanding.   Education regarding symptom management and diagnosis given to patient on AVS.  Continue to follow with Burnard Hawthorne, FNP for routine health maintenance.   Evyn R Manring and I agreed with plan.   [name], FNP

## 2020-10-31 NOTE — Assessment & Plan Note (Addendum)
Resolved. Benign neurologic exam.  Unable to elicit nystagmus, vertigo on exam.  She is not orthostatic.  See flowsheet .symptom has resolved as of today.  Description of symptoms most consistent with vertigo. advised her if it were to recur certainly become more frequent to let me know immediately so we can pursue neuroimaging.  Referral to ENT to be proactive as it relates to vestibular rehab, ENT evaluation if vertigo were to recur.

## 2020-10-31 NOTE — Assessment & Plan Note (Addendum)
We discussed mounjaro.  Discussed black box warning as it relates to medullary thyroid cancer, MEN. Close follow up.

## 2020-10-31 NOTE — Patient Instructions (Addendum)
Referral to ENT  Let us know if you dont hear back within a week in regards to an appointment being scheduled.   Start Mounjaro 2.5mg  once per week injected subcutaneously ( [place])  in stomach. Please clean with alcohol swab prior to injection and be sure to rotate site. You may schedule a nurse visit if you would like to first injection.   After 4 weeks, and if tolerated and weight loss has not reached 1-2 lbs per week, please increase to 5mg  once per week [place].    Please read information on medication below and remember black box warning that you may not take if you or a family member is diagnosed with thyroid cancer (medullary thyroid cancer), or multiple endocrine neoplasia.       Tirzepatide Injection Aimee Medina) What is this medication? TIRZEPATIDE (tir ZEP a tide) treats type 2 diabetes. It works by increasing insulin levels in your body, which decreases your blood sugar (glucose). Changes to diet and exercise are often combined with this medication. This medicine may be used for other purposes; ask your health care provider or pharmacist if you have questions. COMMON BRAND NAME(S): MOUNJARO What should I tell my care team before I take this medication? They need to know if you have any of these conditions: Endocrine tumors (MEN 2) or if someone in your family had these tumors Eye disease, vision problems Gallbladder disease History of pancreatitis Kidney disease Stomach or intestine problems Thyroid cancer or if someone in your family had thyroid cancer An unusual or allergic reaction to tirzepatide, other medications, foods, dyes, or preservatives Pregnant or trying to get pregnant Breast-feeding How should I use this medication? This medication is injected under the skin. You will be taught how to prepare and give it. It is given once every week (every 7 days). Keep taking it unless your health care provider tells you to stop. If you use this medication with insulin, you should  inject this medication and the insulin separately. Do not mix them together. Do not give the injections right next to each other. Change (rotate) injection sites with each injection. This medication comes with INSTRUCTIONS FOR USE. Ask your pharmacist for directions on how to use this medication. Read the information carefully. Talk to your pharmacist or care team if you have questions. It is important that you put your used needles and syringes in a special sharps container. Do not put them in a trash can. If you do not have a sharps container, call your pharmacist or care team to get one. A special MedGuide will be given to you by the pharmacist with each prescription and refill. Be sure to read this information carefully each time. Talk to your care team about the use of this medication in children. Special care may be needed. Overdosage: If you think you have taken too much of this medicine contact a poison control center or emergency room at once. NOTE: This medicine is only for you. Do not share this medicine with others. What if I miss a dose? If you miss a dose, take it as soon as you can unless it is more than 4 days (96 hours) late. If it is more than 4 days late, skip the missed dose. Take the next dose at the normal time. Do not take 2 doses within 3 days of each other. What may interact with this medication? Alcohol containing beverages Antiviral medications for HIV or AIDS Aspirin and aspirin-like medications Beta-blockers like atenolol, metoprolol, propranolol  Certain medications for blood pressure, heart disease, irregular heart beat Chromium Clonidine Diuretics Female hormones, such as estrogens or progestins, birth control pills Fenofibrate Gemfibrozil Guanethidine Isoniazid Lanreotide Female hormones or anabolic steroids MAOIs like Carbex, Eldepryl, Marplan, Nardil, and Parnate Medications for weight loss Medications for allergies, asthma, cold, or cough Medications for  depression, anxiety, or psychotic disturbances Niacin Nicotine NSAIDs, medications for pain and inflammation, like ibuprofen or naproxen Octreotide Other medications for diabetes, like glyburide, glipizide, or glimepiride Pasireotide Pentamidine Phenytoin Probenecid Quinolone antibiotics such as ciprofloxacin, levofloxacin, ofloxacin Reserpine Some herbal dietary supplements Steroid medications such as prednisone or cortisone Sulfamethoxazole; trimethoprim Thyroid hormones Warfarin This list may not describe all possible interactions. Give your health care provider a list of all the medicines, herbs, non-prescription drugs, or dietary supplements you use. Also tell them if you smoke, drink alcohol, or use illegal drugs. Some items may interact with your medicine. What should I watch for while using this medication? Visit your care team for regular checks on your progress. Drink plenty of fluids while taking this medication. Check with your care team if you get an attack of severe diarrhea, nausea, and vomiting. The loss of too much body fluid can make it dangerous for you to take this medication. A test called the HbA1C (A1C) will be monitored. This is a simple blood test. It measures your blood sugar control over the last 2 to 3 months. You will receive this test every 3 to 6 months. Learn how to check your blood sugar. Learn the symptoms of low and high blood sugar and how to manage them. Always carry a quick-source of sugar with you in case you have symptoms of low blood sugar. Examples include hard sugar candy or glucose tablets. Make sure others know that you can choke if you eat or drink when you develop serious symptoms of low blood sugar, such as seizures or unconsciousness. They must get medical help at once. Tell your care team if you have high blood sugar. You might need to change the dose of your medication. If you are sick or exercising more than usual, you might need to change  the dose of your medication. Do not skip meals. Ask your care team if you should avoid alcohol. Many nonprescription cough and cold products contain sugar or alcohol. These can affect blood sugar. Pens should never be shared. Even if the needle is changed, sharing may result in passing of viruses like hepatitis or HIV. Wear a medical ID bracelet or chain, and carry a card that describes your disease and details of your medication and dosage times. Birth control may not work properly while you are taking this medication. If you take birth control pills by mouth, your care team may recommend another type of birth control for 4 weeks after you start this medication and for 4 weeks after each increase in your dose of this medication. Ask your care team which birth control methods you should use. What side effects may I notice from receiving this medication? Side effects that you should report to your care team as soon as possible: Allergic reactions-skin rash, itching, hives, swelling of the face, lips, tongue, or throat Change in vision Dehydration-increased thirst, dry mouth, feeling faint or lightheaded, headache, dark yellow or brown urine Gallbladder problems-severe stomach pain, nausea, vomiting, fever Kidney injury-decrease in the amount of urine, swelling of the ankles, hands, or feet Pancreatitis-severe stomach pain that spreads to your back or gets worse after eating or when touched,  fever, nausea, vomiting Thyroid cancer-new mass or lump in the neck, pain or trouble swallowing, trouble breathing, hoarseness Side effects that usually do not require medical attention (report these to your care team if they continue or are bothersome): Constipation Diarrhea Loss of Appetite Nausea Stomach pain Upset stomach Vomiting This list may not describe all possible side effects. Call your doctor for medical advice about side effects. You may report side effects to FDA at 1-800-FDA-1088. Where should  I keep my medication? Keep out of the reach of children and pets. Refrigeration (preferred): Store unopened pens in a refrigerator between 2 and 8 degrees C (36 and 46 degrees F). Keep it in the original carton until you are ready to take it. Do not freeze or use if the medication has been frozen. Protect from light. Get rid of any unused medication after the expiration date on the label. Room Temperature: The pen may be stored at room temperature below 30 degrees C (86 degrees F) for up to a total of 21 days if needed. Protect from light. Avoid exposure to extreme heat. If it is stored at room temperature, throw away any unused medication after 21 days or after it expires, whichever is first. The pen has glass parts. Handle it carefully. If you drop the pen on a hard surface, do not use it. Use a new pen for your injection. To get rid of medications that are no longer needed or have expired: Take the medication to a medication take-back program. Check with your pharmacy or law enforcement to find a location. If you cannot return the medication, ask your pharmacist or care team how to get rid of this medication safely. NOTE: This sheet is a summary. It may not cover all possible information. If you have questions about this medicine, talk to your doctor, pharmacist, or health care provider.  2022 Elsevier/Gold Standard (2020-05-31 13:57:48)

## 2020-11-01 ENCOUNTER — Other Ambulatory Visit: Payer: Self-pay

## 2020-11-01 LAB — TSH: TSH: 2.36 u[IU]/mL (ref 0.35–5.50)

## 2020-11-01 LAB — CBC WITH DIFFERENTIAL/PLATELET
Basophils Absolute: 0 10*3/uL (ref 0.0–0.1)
Basophils Relative: 0.6 % (ref 0.0–3.0)
Eosinophils Absolute: 0.2 10*3/uL (ref 0.0–0.7)
Eosinophils Relative: 2.5 % (ref 0.0–5.0)
HCT: 39.1 % (ref 36.0–46.0)
Hemoglobin: 12.8 g/dL (ref 12.0–15.0)
Lymphocytes Relative: 33.9 % (ref 12.0–46.0)
Lymphs Abs: 2.4 10*3/uL (ref 0.7–4.0)
MCHC: 32.6 g/dL (ref 30.0–36.0)
MCV: 89.2 fl (ref 78.0–100.0)
Monocytes Absolute: 0.4 10*3/uL (ref 0.1–1.0)
Monocytes Relative: 5.4 % (ref 3.0–12.0)
Neutro Abs: 4.1 10*3/uL (ref 1.4–7.7)
Neutrophils Relative %: 57.6 % (ref 43.0–77.0)
Platelets: 285 10*3/uL (ref 150.0–400.0)
RBC: 4.38 Mil/uL (ref 3.87–5.11)
RDW: 13.2 % (ref 11.5–15.5)
WBC: 7 10*3/uL (ref 4.0–10.5)

## 2020-11-01 LAB — COMPREHENSIVE METABOLIC PANEL
ALT: 17 U/L (ref 0–35)
AST: 19 U/L (ref 0–37)
Albumin: 4.1 g/dL (ref 3.5–5.2)
Alkaline Phosphatase: 71 U/L (ref 39–117)
BUN: 14 mg/dL (ref 6–23)
CO2: 26 mEq/L (ref 19–32)
Calcium: 9.1 mg/dL (ref 8.4–10.5)
Chloride: 105 mEq/L (ref 96–112)
Creatinine, Ser: 0.93 mg/dL (ref 0.40–1.20)
GFR: 75.17 mL/min (ref >60.00)
Glucose, Bld: 84 mg/dL (ref 70–99)
Potassium: 4.4 mEq/L (ref 3.5–5.1)
Sodium: 138 mEq/L (ref 135–145)
Total Bilirubin: 0.2 mg/dL (ref 0.2–1.2)
Total Protein: 6.5 g/dL (ref 6.0–8.3)

## 2020-11-01 LAB — HEMOGLOBIN A1C: Hgb A1c MFr Bld: 5.5 % (ref 4.6–6.5)

## 2020-11-10 ENCOUNTER — Other Ambulatory Visit: Payer: Self-pay

## 2020-11-10 MED FILL — Cabergoline Tab 0.5 MG: ORAL | 84 days supply | Qty: 24 | Fill #0 | Status: AC

## 2020-11-21 ENCOUNTER — Encounter: Payer: Self-pay | Admitting: Family

## 2020-11-21 ENCOUNTER — Other Ambulatory Visit: Payer: Self-pay

## 2020-11-21 ENCOUNTER — Other Ambulatory Visit: Payer: Self-pay | Admitting: Family

## 2020-11-21 DIAGNOSIS — E669 Obesity, unspecified: Secondary | ICD-10-CM

## 2020-11-21 MED ORDER — TIRZEPATIDE 5 MG/0.5ML ~~LOC~~ SOAJ
5.0000 mg | SUBCUTANEOUS | 3 refills | Status: DC
  Filled 2020-11-21: qty 6, 84d supply, fill #0
  Filled 2021-02-06: qty 2, 28d supply, fill #1
  Filled 2021-03-10: qty 2, 28d supply, fill #2
  Filled 2021-04-04: qty 2, 28d supply, fill #3

## 2020-11-22 ENCOUNTER — Other Ambulatory Visit: Payer: Self-pay

## 2020-11-24 ENCOUNTER — Encounter: Payer: Self-pay | Admitting: Radiology

## 2020-12-23 ENCOUNTER — Other Ambulatory Visit: Payer: Self-pay | Admitting: Family

## 2020-12-23 DIAGNOSIS — Z1231 Encounter for screening mammogram for malignant neoplasm of breast: Secondary | ICD-10-CM

## 2020-12-29 DIAGNOSIS — Z1231 Encounter for screening mammogram for malignant neoplasm of breast: Secondary | ICD-10-CM

## 2021-01-02 ENCOUNTER — Ambulatory Visit (INDEPENDENT_AMBULATORY_CARE_PROVIDER_SITE_OTHER): Payer: No Typology Code available for payment source | Admitting: Family

## 2021-01-02 ENCOUNTER — Encounter: Payer: Self-pay | Admitting: Family

## 2021-01-02 ENCOUNTER — Other Ambulatory Visit: Payer: Self-pay

## 2021-01-02 DIAGNOSIS — E669 Obesity, unspecified: Secondary | ICD-10-CM | POA: Diagnosis not present

## 2021-01-02 NOTE — Progress Notes (Signed)
Subjective:    Patient ID: Aimee Medina, female    DOB: 1975/08/22, 45 y.o.   MRN: [id number]  CC: [name] is a 45 y.o. female who presents today for follow up.   HPI: Feels well today.  No new complaints.  She is very pleased with weight loss.  She has lost 24 pounds in the past 8 weeks.  She is compliant with mounjaro 5mg . She has had constipation since starting medication and is taking Smoothe Move tea with relief. BM are daily now.She is drinking more water.She is drinking Boost high protein to ensure she is eating.  Endorses decreased  On average 2 lbs per week.   Vertigo- referral to ENT and she was seen by Dr Pryor Ochoa. Symptoms have resolved at this time.    Mammogram is scheduled  HISTORY:  Past Medical History:  Diagnosis Date   Allergy    Dermatitis    Obesity    Perennial allergic rhinitis    Positive ANA (antinuclear antibody) 11/27/2019   Consult with dr patel rheumatology 11/21 and felt false positive ana.    Psoriasis    Seasonal and perennial allergic rhinitis 01/21/2015   Vitamin D deficiency 03/27/2017   Past Surgical History:  Procedure Laterality Date   ABDOMINOPLASTY  2015   APPENDECTOMY  2011   Family History  Problem Relation Age of Onset   High blood pressure Mother    Heart disease Mother    Stroke Mother        brain aneursym   Hypertension Father    Stroke Father    Prostate cancer Father 18       prostate- currently in remission   High Cholesterol Father    Pancreatic cancer Maternal Grandmother 63   Colon cancer Maternal Grandfather 69   Prostate cancer Paternal Grandfather 38   Allergies Daughter    Allergies Son    Breast cancer Neg Hx    Thyroid cancer Neg Hx     Allergies: Patient has no known allergies. Current Outpatient Medications on File Prior to Visit  Medication Sig Dispense Refill   cabergoline (DOSTINEX) 0.5 MG tablet Take 0.5 mg by mouth 2 (two) times a week.     cabergoline (DOSTINEX) 0.5 MG tablet TAKE 1 TABLET (0.5  MG) BY MOUTH TWICE A WEEK 24 tablet 1   tirzepatide (MOUNJARO) 5 MG/0.5ML Pen Inject 5 mg into the skin once a week. 6 mL 3   No current facility-administered medications on file prior to visit.    Social History   Tobacco Use   Smoking status: Never   Smokeless tobacco: Never  Vaping Use   Vaping Use: Never used  Substance Use Topics   Alcohol use: No    Alcohol/week: 0.0 standard drinks    Comment: very rare   Drug use: No    Review of Systems  Constitutional:  Negative for chills and fever.  Respiratory:  Negative for cough.   Cardiovascular:  Negative for chest pain and palpitations.  Gastrointestinal:  Negative for nausea and vomiting.     Objective:    BP 116/64    Pulse 100    Temp 98.3 F (36.8 C) (Oral)    Ht 5\' 7"  (1.702 m)    Wt 221 lb 6.4 oz (100.4 kg)    SpO2 98%    BMI 34.68 kg/m  BP Readings from Last 3 Encounters:  01/02/21 116/64  10/31/20 118/80  01/12/20 127/83   Wt Readings from Last  3 Encounters:  01/02/21 221 lb 6.4 oz (100.4 kg)  10/31/20 248 lb (112.5 kg)  01/12/20 241 lb (109.3 kg)    Physical Exam Vitals reviewed.  Constitutional:      Appearance: She is well-developed.  Eyes:     Conjunctiva/sclera: Conjunctivae normal.  Cardiovascular:     Rate and Rhythm: Normal rate and regular rhythm.     Pulses: Normal pulses.     Heart sounds: Normal heart sounds.  Pulmonary:     Effort: Pulmonary effort is normal.     Breath sounds: Normal breath sounds. No wheezing, rhonchi or rales.  Skin:    General: Skin is warm and dry.  Neurological:     Mental Status: She is alert.  Psychiatric:        Speech: Speech normal.        Behavior: Behavior normal.        Thought Content: Thought content normal.       Assessment & Plan:   Problem List Items Addressed This Visit       Other   Obesity, Class II, BMI 35-39.9, isolated    Congratulated patient on weight loss.  Continue mounjaro 5 mg.  She is using smooth move tea at home to  regulate bowels.  She will let me know if this were to change        I am having Myah R. Florer maintain her cabergoline, cabergoline, and tirzepatide.   No orders of the defined types were placed in this encounter.   Return precautions given.   Risks, benefits, and alternatives of the medications and treatment plan prescribed today were discussed, and patient expressed understanding.   Education regarding symptom management and diagnosis given to patient on AVS.  Continue to follow with Burnard Hawthorne, FNP for routine health maintenance.   Aaralyn R Hurlock and I agreed with plan.   [name], FNP

## 2021-01-02 NOTE — Assessment & Plan Note (Signed)
Congratulated patient on weight loss.  Continue mounjaro 5 mg.  She is using smooth move tea at home to regulate bowels.  She will let me know if this were to change

## 2021-01-26 ENCOUNTER — Ambulatory Visit
Admission: RE | Admit: 2021-01-26 | Discharge: 2021-01-26 | Disposition: A | Discharge status: Home / Self Care | Payer: No Typology Code available for payment source | Source: Ambulatory Visit | Attending: Family | Admitting: Family

## 2021-01-26 DIAGNOSIS — Z1231 Encounter for screening mammogram for malignant neoplasm of breast: Secondary | ICD-10-CM

## 2021-02-06 ENCOUNTER — Other Ambulatory Visit: Payer: Self-pay

## 2021-02-07 ENCOUNTER — Other Ambulatory Visit: Payer: Self-pay

## 2021-03-07 ENCOUNTER — Other Ambulatory Visit: Payer: Self-pay

## 2021-03-07 MED ORDER — CABERGOLINE 0.5 MG PO TABS
ORAL_TABLET | ORAL | 3 refills | Status: DC
  Filled 2021-03-07: qty 24, 84d supply, fill #0
  Filled 2021-07-28: qty 24, 84d supply, fill #1
  Filled 2021-10-30: qty 24, 84d supply, fill #2

## 2021-03-10 ENCOUNTER — Other Ambulatory Visit: Payer: Self-pay

## 2021-03-13 ENCOUNTER — Other Ambulatory Visit: Payer: No Typology Code available for payment source

## 2021-03-13 ENCOUNTER — Other Ambulatory Visit: Payer: Self-pay

## 2021-03-13 DIAGNOSIS — Z01419 Encounter for gynecological examination (general) (routine) without abnormal findings: Secondary | ICD-10-CM

## 2021-03-13 NOTE — Progress Notes (Unsigned)
Pt showed up for annual today by mistake, asked if she could get labs done while she was here

## 2021-03-14 ENCOUNTER — Ambulatory Visit (INDEPENDENT_AMBULATORY_CARE_PROVIDER_SITE_OTHER): Payer: No Typology Code available for payment source | Admitting: Obstetrics & Gynecology

## 2021-03-14 ENCOUNTER — Encounter: Payer: Self-pay | Admitting: Obstetrics & Gynecology

## 2021-03-14 ENCOUNTER — Other Ambulatory Visit (HOSPITAL_COMMUNITY)
Admission: RE | Admit: 2021-03-14 | Discharge: 2021-03-14 | Disposition: A | Discharge status: Home / Self Care | Payer: No Typology Code available for payment source | Source: Ambulatory Visit | Attending: Obstetrics & Gynecology | Admitting: Obstetrics & Gynecology

## 2021-03-14 VITALS — BP 122/79 | HR 93 | Wt 200.0 lb

## 2021-03-14 DIAGNOSIS — R87610 Atypical squamous cells of undetermined significance on cytologic smear of cervix (ASC-US): Secondary | ICD-10-CM | POA: Diagnosis present

## 2021-03-14 DIAGNOSIS — Z01419 Encounter for gynecological examination (general) (routine) without abnormal findings: Secondary | ICD-10-CM | POA: Insufficient documentation

## 2021-03-14 LAB — COMPREHENSIVE METABOLIC PANEL
ALT: 14 IU/L (ref 0–32)
AST: 20 IU/L (ref 0–40)
Albumin/Globulin Ratio: 2 (ref 1.2–2.2)
Albumin: 4.5 g/dL (ref 3.8–4.8)
Alkaline Phosphatase: 66 IU/L (ref 44–121)
BUN/Creatinine Ratio: 10 (ref 9–23)
BUN: 9 mg/dL (ref 6–24)
Bilirubin Total: 0.5 mg/dL (ref 0.0–1.2)
CO2: 20 mmol/L (ref 20–29)
Calcium: 9.5 mg/dL (ref 8.7–10.2)
Chloride: 104 mmol/L (ref 96–106)
Creatinine, Ser: 0.92 mg/dL (ref 0.57–1.00)
Globulin, Total: 2.3 g/dL (ref 1.5–4.5)
Glucose: 81 mg/dL (ref 70–99)
Potassium: 4.4 mmol/L (ref 3.5–5.2)
Sodium: 142 mmol/L (ref 134–144)
Total Protein: 6.8 g/dL (ref 6.0–8.5)
eGFR: 79 mL/min/{1.73_m2} (ref >59)

## 2021-03-14 LAB — LIPID PANEL
Chol/HDL Ratio: 4 ratio (ref 0.0–4.4)
Cholesterol, Total: 181 mg/dL (ref 100–199)
HDL: 45 mg/dL (ref >39)
LDL Chol Calc (NIH): 123 mg/dL — ABNORMAL HIGH (ref 0–99)
Triglycerides: 71 mg/dL (ref 0–149)
VLDL Cholesterol Cal: 13 mg/dL (ref 5–40)

## 2021-03-14 LAB — CBC
Hematocrit: 40.3 % (ref 34.0–46.6)
Hemoglobin: 13.4 g/dL (ref 11.1–15.9)
MCH: 30 pg (ref 26.6–33.0)
MCHC: 33.3 g/dL (ref 31.5–35.7)
MCV: 90 fL (ref 79–97)
Platelets: 289 10*3/uL (ref 150–450)
RBC: 4.46 x10E6/uL (ref 3.77–5.28)
RDW: 13.2 % (ref 11.7–15.4)
WBC: 5.4 10*3/uL (ref 3.4–10.8)

## 2021-03-14 LAB — HIV ANTIBODY (ROUTINE TESTING W REFLEX): HIV Screen 4th Generation wRfx: NONREACTIVE

## 2021-03-14 LAB — HEPATITIS B SURFACE ANTIGEN: Hepatitis B Surface Ag: NEGATIVE

## 2021-03-14 LAB — HEMOGLOBIN A1C
Est. average glucose Bld gHb Est-mCnc: 97 mg/dL
Hgb A1c MFr Bld: 5 % (ref 4.8–5.6)

## 2021-03-14 LAB — RPR: RPR Ser Ql: NONREACTIVE

## 2021-03-14 LAB — HEPATITIS C ANTIBODY: Hep C Virus Ab: NONREACTIVE

## 2021-03-14 LAB — TSH: TSH: 3.5 u[IU]/mL (ref 0.450–4.500)

## 2021-03-14 NOTE — Progress Notes (Signed)
GYNECOLOGY ANNUAL PREVENTATIVE CARE ENCOUNTER NOTE  History:     Aimee Medina is a 46 y.o. [phone] female here for a routine annual gynecologic exam.  Current complaints: none.   Denies abnormal vaginal bleeding, discharge, pelvic pain, problems with intercourse or other gynecologic concerns.    Gynecologic History Patient's last menstrual period was 02/27/2021 (exact date). Contraception: none Last Pap: 01/12/2020. Result was ASCUS with negative HPV. Same result in 2020. Last Mammogram: 01/26/2021.  Result was normal  Obstetric History OB History  Gravida Para Term Preterm AB Living  '4 2 2 ' 0 2 2  SAB IAB Ectopic Multiple Live Births  1 1 0 0 2    # Outcome Date GA Lbr Len/2nd Weight Sex Delivery Anes PTL Lv  4 IAB      TAB     3 SAB      SAB     2 Term      Vag-Spont   LIV  1 Term      Vag-Spont   LIV    Past Medical History:  Diagnosis Date   Allergy    Dermatitis    Obesity    Perennial allergic rhinitis    Positive ANA (antinuclear antibody) 11/27/2019   Consult with dr patel rheumatology 11/21 and felt false positive ana.    Psoriasis    Seasonal and perennial allergic rhinitis 01/21/2015   Vitamin D deficiency 03/27/2017    Past Surgical History:  Procedure Laterality Date   ABDOMINOPLASTY  2015   APPENDECTOMY  2011    Current Outpatient Medications on File Prior to Visit  Medication Sig Dispense Refill   cabergoline (DOSTINEX) 0.5 MG tablet Take 1 tablet (0.5 mg total) by mouth twice a week 24 tablet 3   tirzepatide (MOUNJARO) 5 MG/0.5ML Pen Inject 5 mg into the skin once a week. 6 mL 3   No current facility-administered medications on file prior to visit.    No Known Allergies  Social History:  reports that she has never smoked. She has never used smokeless tobacco. She reports that she does not drink alcohol and does not use drugs.  Family History  Problem Relation Age of Onset   High blood pressure Mother    Heart disease Mother    Stroke Mother         brain aneursym   Hypertension Father    Stroke Father    Prostate cancer Father 69       prostate- currently in remission   High Cholesterol Father    Pancreatic cancer Maternal Grandmother 7   Colon cancer Maternal Grandfather 69   Prostate cancer Paternal Grandfather 61   Allergies Daughter    Allergies Son    Breast cancer Neg Hx    Thyroid cancer Neg Hx     The following portions of the patient's history were reviewed and updated as appropriate: allergies, current medications, past family history, past medical history, past social history, past surgical history and problem list.  Review of Systems Pertinent items noted in HPI and remainder of comprehensive ROS otherwise negative.  Physical Exam:  BP 122/79    Pulse 93    Wt 200 lb (90.7 kg)    LMP 02/27/2021 (Exact Date)    BMI 31.32 kg/m  CONSTITUTIONAL: Well-developed, well-nourished female in no acute distress.  HENT:  Normocephalic, atraumatic, External right and left ear normal.  EYES: Conjunctivae and EOM are normal. Pupils are equal, round, and reactive to light. No scleral  icterus.  NECK: Normal range of motion, supple, no masses.  Normal thyroid.  SKIN: Skin is warm and dry. No rash noted. Not diaphoretic. No erythema. No pallor. MUSCULOSKELETAL: Normal range of motion. No tenderness.  No cyanosis, clubbing, or edema. NEUROLOGIC: Alert and oriented to person, place, and time. Normal reflexes, muscle tone coordination.  PSYCHIATRIC: Normal mood and affect. Normal behavior. Normal judgment and thought content. CARDIOVASCULAR: Normal heart rate noted, regular rhythm RESPIRATORY: Clear to auscultation bilaterally. Effort and breath sounds normal, no problems with respiration noted. BREASTS: Symmetric in size. No masses, tenderness, skin changes, nipple drainage, or lymphadenopathy bilaterally. Performed in the presence of a chaperone. ABDOMEN: Soft, no distention noted.  No tenderness, rebound or guarding.  PELVIC:  Normal appearing external genitalia and urethral meatus; normal appearing vaginal mucosa and cervix.  No abnormal vaginal discharge noted.  Pap smear obtained.  Normal uterine size, no other palpable masses, no uterine or adnexal tenderness.  Performed in the presence of a chaperone.   Results for orders placed or performed in visit on 03/13/21 (from the past 48 hour(s))  CBC     Status: None   Collection Time: 03/13/21  8:34 AM  Result Value Ref Range   WBC 5.4 3.4 - 10.8 x10E3/uL   RBC 4.46 3.77 - 5.28 x10E6/uL   Hemoglobin 13.4 11.1 - 15.9 g/dL   Hematocrit 40.3 34.0 - 46.6 %   MCV 90 79 - 97 fL   MCH 30.0 26.6 - 33.0 pg   MCHC 33.3 31.5 - 35.7 g/dL   RDW 13.2 11.7 - 15.4 %   Platelets 289 150 - 450 x10E3/uL  TSH     Status: None   Collection Time: 03/13/21  8:34 AM  Result Value Ref Range   TSH 3.500 0.450 - 4.500 uIU/mL  Hemoglobin A1c     Status: None   Collection Time: 03/13/21  8:34 AM  Result Value Ref Range   Hgb A1c MFr Bld 5.0 4.8 - 5.6 %    Comment:          Prediabetes: 5.7 - 6.4          Diabetes: >6.4          Glycemic control for adults with diabetes: <7.0    Est. average glucose Bld gHb Est-mCnc 97 mg/dL  Comprehensive metabolic panel     Status: None   Collection Time: 03/13/21  8:34 AM  Result Value Ref Range   Glucose 81 70 - 99 mg/dL   BUN 9 6 - 24 mg/dL   Creatinine, Ser 0.92 0.57 - 1.00 mg/dL   eGFR 79 >59 mL/min/1.73   BUN/Creatinine Ratio 10 9 - 23   Sodium 142 134 - 144 mmol/L   Potassium 4.4 3.5 - 5.2 mmol/L   Chloride 104 96 - 106 mmol/L   CO2 20 20 - 29 mmol/L   Calcium 9.5 8.7 - 10.2 mg/dL   Total Protein 6.8 6.0 - 8.5 g/dL   Albumin 4.5 3.8 - 4.8 g/dL   Globulin, Total 2.3 1.5 - 4.5 g/dL   Albumin/Globulin Ratio 2.0 1.2 - 2.2   Bilirubin Total 0.5 0.0 - 1.2 mg/dL   Alkaline Phosphatase 66 44 - 121 IU/L   AST 20 0 - 40 IU/L   ALT 14 0 - 32 IU/L  Lipid panel     Status: Abnormal   Collection Time: 03/13/21  8:34 AM  Result Value Ref  Range   Cholesterol, Total 181 100 - 199 mg/dL  Triglycerides 71 0 - 149 mg/dL   HDL 45 >39 mg/dL   VLDL Cholesterol Cal 13 5 - 40 mg/dL   LDL Chol Calc (NIH) 123 (H) 0 - 99 mg/dL   Chol/HDL Ratio 4.0 0.0 - 4.4 ratio    Comment:                                   T. Chol/HDL Ratio                                             Men  Women                               1/2 Avg.Risk  3.4    3.3                                   Avg.Risk  5.0    4.4                                2X Avg.Risk  9.6    7.1                                3X Avg.Risk 23.4   11.0   Hepatitis C antibody     Status: None   Collection Time: 03/13/21  8:34 AM  Result Value Ref Range   Hep C Virus Ab Non Reactive Non Reactive    Comment: HCV antibody alone does not differentiate between previously resolved infection and active infection. Equivocal and Reactive HCV antibody results should be followed up with an HCV RNA test to support the diagnosis of active HCV infection.   Hepatitis B surface antigen     Status: None   Collection Time: 03/13/21  8:34 AM  Result Value Ref Range   Hepatitis B Surface Ag Negative Negative  HIV Antibody (routine testing w rflx)     Status: None   Collection Time: 03/13/21  8:34 AM  Result Value Ref Range   HIV Screen 4th Generation wRfx Non Reactive Non Reactive    Comment: HIV Negative HIV-1/HIV-2 antibodies and HIV-1 p24 antigen were NOT detected. There is no laboratory evidence of HIV infection.   RPR     Status: None   Collection Time: 03/13/21  8:34 AM  Result Value Ref Range   RPR Ser Ql Non Reactive Non Reactive      Assessment and Plan:    1. ASCUS of cervix with negative high risk HPV on 01/12/2020 Repeat pap done today, will follow up results and manage accordingly. - Cytology - PAP  2. Well woman exam with routine gynecological exam - Cytology - PAP Will follow up results of pap smear and manage accordingly. Mammogram is up to date Routine preventative health  maintenance measures emphasized.  Reviewed results from recent labs with patient, recommended diet modification due to elevated LDL cholesterol Please refer to After Visit Summary for other counseling recommendations.      Verita Schneiders, MD, [place] [facility]  [facility], [place]

## 2021-03-16 LAB — CYTOLOGY - PAP
Chlamydia: NEGATIVE
Comment: NEGATIVE
Comment: NEGATIVE
Comment: NEGATIVE
Comment: NORMAL
Diagnosis: UNDETERMINED — AB
High risk HPV: NEGATIVE
Neisseria Gonorrhea: NEGATIVE
Trichomonas: NEGATIVE

## 2021-03-23 ENCOUNTER — Encounter: Payer: Self-pay | Admitting: Family

## 2021-04-05 ENCOUNTER — Other Ambulatory Visit: Payer: Self-pay

## 2021-05-10 ENCOUNTER — Encounter: Payer: Self-pay | Admitting: Family

## 2021-05-10 ENCOUNTER — Other Ambulatory Visit: Payer: Self-pay

## 2021-05-10 ENCOUNTER — Other Ambulatory Visit: Payer: Self-pay | Admitting: Family

## 2021-05-10 DIAGNOSIS — E669 Obesity, unspecified: Secondary | ICD-10-CM

## 2021-05-10 MED ORDER — WEGOVY 0.25 MG/0.5ML ~~LOC~~ SOAJ
0.2500 mg | SUBCUTANEOUS | 1 refills | Status: DC
  Filled 2021-05-10 – 2021-05-18 (×3): qty 2, 28d supply, fill #0

## 2021-05-15 ENCOUNTER — Telehealth: Payer: Self-pay

## 2021-05-15 ENCOUNTER — Other Ambulatory Visit: Payer: Self-pay

## 2021-05-15 NOTE — Telephone Encounter (Signed)
LMTCB to office to let her know she was approved for [facility] from 05/12/21- 05/13/2022 ?

## 2021-05-17 ENCOUNTER — Other Ambulatory Visit: Payer: Self-pay

## 2021-05-18 ENCOUNTER — Other Ambulatory Visit: Payer: Self-pay

## 2021-06-09 ENCOUNTER — Other Ambulatory Visit: Payer: Self-pay | Admitting: Family

## 2021-06-09 ENCOUNTER — Other Ambulatory Visit: Payer: Self-pay

## 2021-06-09 ENCOUNTER — Encounter: Payer: Self-pay | Admitting: Family

## 2021-06-09 DIAGNOSIS — E669 Obesity, unspecified: Secondary | ICD-10-CM

## 2021-06-09 MED ORDER — WEGOVY 0.5 MG/0.5ML ~~LOC~~ SOAJ
0.5000 mg | SUBCUTANEOUS | 2 refills | Status: DC
  Filled 2021-06-09: qty 2, 28d supply, fill #0

## 2021-06-14 ENCOUNTER — Other Ambulatory Visit: Payer: Self-pay

## 2021-06-28 ENCOUNTER — Encounter: Payer: Self-pay | Admitting: Family

## 2021-06-28 ENCOUNTER — Telehealth (INDEPENDENT_AMBULATORY_CARE_PROVIDER_SITE_OTHER): Payer: No Typology Code available for payment source | Admitting: Family

## 2021-06-28 ENCOUNTER — Other Ambulatory Visit: Payer: Self-pay

## 2021-06-28 VITALS — BP 112/72 | HR 95 | Temp 98.4°F | Wt 175.6 lb

## 2021-06-28 DIAGNOSIS — E669 Obesity, unspecified: Secondary | ICD-10-CM | POA: Diagnosis not present

## 2021-06-28 MED ORDER — WEGOVY 1 MG/0.5ML ~~LOC~~ SOAJ
1.0000 mg | SUBCUTANEOUS | 3 refills | Status: DC
  Filled 2021-06-28: qty 2, 28d supply, fill #0
  Filled 2021-07-28: qty 2, 28d supply, fill #1
  Filled 2021-08-25: qty 2, 28d supply, fill #2
  Filled 2021-09-22: qty 2, 28d supply, fill #3

## 2021-06-28 NOTE — Progress Notes (Signed)
Virtual Visit via Video Note  I connected with@  on 06/28/21 at  3:00 PM EDT by a video enabled telemedicine application and verified that I am speaking with the correct person using two identifiers.  Location patient: home Location provider:work  Persons participating in the virtual visit: patient, provider  I discussed the limitations of evaluation and management by telemedicine and the availability of in person appointments. The patient expressed understanding and agreed to proceed.   HPI:  Previously on mounjaro She is compliant with wegovy 0.'5mg'$  and tolerating medication She has lost 40 lbs.  She feels 'so good'. She is walking daily and eating better. Energy has improved.   ROS: See pertinent positives and negatives per HPI.    EXAM:  VITALS per patient if applicable: BP 753/00 (BP Location: Left Arm, Patient Position: Sitting, Cuff Size: Normal)   Pulse 95   Temp 98.4 F (36.9 C) (Oral)   Wt 175 lb 9.6 oz (79.7 kg)   LMP  (LMP Unknown)   SpO2 97%   BMI 27.50 kg/m  BP Readings from Last 3 Encounters:  06/28/21 112/72  03/14/21 122/79  01/02/21 116/64   Wt Readings from Last 3 Encounters:  06/28/21 175 lb 9.6 oz (79.7 kg)  03/14/21 200 lb (90.7 kg)  01/02/21 221 lb 6.4 oz (100.4 kg)    GENERAL: alert, oriented, appears well and in no acute distress  HEENT: atraumatic, conjunttiva clear, no obvious abnormalities on inspection of external nose and ears  NECK: normal movements of the head and neck  LUNGS: on inspection no signs of respiratory distress, breathing rate appears normal, no obvious gross SOB, gasping or wheezing  CV: no obvious cyanosis  MS: moves all visible extremities without noticeable abnormality  PSYCH/NEURO: pleasant and cooperative, no obvious depression or anxiety, speech and thought processing grossly intact  ASSESSMENT AND PLAN:  Discussed the following assessment and plan:  Problem List Items Addressed This Visit        Other   Obesity, Class II, BMI 35-39.9, isolated - Primary    Tremendous weight loss and approaching normal BMI. Agreed to increase wegovy to '1mg'$  to aid in further weight loss. She will let me know how she is doing      Relevant Medications   Semaglutide-Weight Management (WEGOVY) 1 MG/0.5ML SOAJ    -we discussed possible serious and likely etiologies, options for evaluation and workup, limitations of telemedicine visit vs in person visit, treatment, treatment risks and precautions. Pt prefers to treat via telemedicine empirically rather then risking or undertaking an in person visit at this moment.  .   I discussed the assessment and treatment plan with the patient. The patient was provided an opportunity to ask questions and all were answered. The patient agreed with the plan and demonstrated an understanding of the instructions.   The patient was advised to call back or seek an in-person evaluation if the symptoms worsen or if the condition fails to improve as anticipated.   [name], FNP

## 2021-06-28 NOTE — Patient Instructions (Signed)
Increase wegovy to '1mg'$   Hats off to you and all your hard work!

## 2021-06-28 NOTE — Assessment & Plan Note (Signed)
Tremendous weight loss and approaching normal BMI. Agreed to increase wegovy to '1mg'$  to aid in further weight loss. She will let me know how she is doing

## 2021-07-03 ENCOUNTER — Ambulatory Visit: Payer: No Typology Code available for payment source | Admitting: Family

## 2021-07-06 ENCOUNTER — Telehealth: Payer: Self-pay

## 2021-07-06 NOTE — Telephone Encounter (Signed)
07/03/2021 left detailed voicemail for patient on mobile phone asking her to please call us back to schedule a follow-up appointment from her [place].  We need to schedule a 15-monthfollow-up for chronic management.

## 2021-07-19 ENCOUNTER — Other Ambulatory Visit: Payer: Self-pay

## 2021-07-28 ENCOUNTER — Other Ambulatory Visit: Payer: Self-pay

## 2021-08-25 ENCOUNTER — Other Ambulatory Visit: Payer: Self-pay

## 2021-09-07 ENCOUNTER — Other Ambulatory Visit: Payer: Self-pay

## 2021-09-07 MED ORDER — CABERGOLINE 0.5 MG PO TABS
0.5000 mg | ORAL_TABLET | ORAL | 3 refills | Status: DC
  Filled 2021-09-07 – 2022-01-25 (×2): qty 24, 84d supply, fill #0

## 2021-09-22 ENCOUNTER — Other Ambulatory Visit: Payer: Self-pay

## 2021-10-30 ENCOUNTER — Other Ambulatory Visit: Payer: Self-pay | Admitting: Family

## 2021-10-30 DIAGNOSIS — E669 Obesity, unspecified: Secondary | ICD-10-CM

## 2021-10-31 ENCOUNTER — Other Ambulatory Visit: Payer: Self-pay

## 2021-10-31 MED ORDER — WEGOVY 1 MG/0.5ML ~~LOC~~ SOAJ
1.0000 mg | SUBCUTANEOUS | 3 refills | Status: DC
  Filled 2021-10-31: qty 2, 28d supply, fill #0
  Filled 2021-12-12 – 2021-12-22 (×3): qty 2, 28d supply, fill #1
  Filled 2022-01-25: qty 2, 28d supply, fill #2
  Filled 2022-03-14: qty 2, 28d supply, fill #3

## 2021-12-14 ENCOUNTER — Other Ambulatory Visit: Payer: Self-pay

## 2021-12-17 ENCOUNTER — Other Ambulatory Visit: Payer: Self-pay

## 2021-12-22 ENCOUNTER — Other Ambulatory Visit: Payer: Self-pay

## 2021-12-22 ENCOUNTER — Telehealth: Payer: Self-pay

## 2021-12-22 NOTE — Telephone Encounter (Signed)
error 

## 2021-12-26 ENCOUNTER — Ambulatory Visit (INDEPENDENT_AMBULATORY_CARE_PROVIDER_SITE_OTHER): Payer: No Typology Code available for payment source | Admitting: Family

## 2021-12-26 ENCOUNTER — Encounter: Payer: Self-pay | Admitting: Family

## 2021-12-26 VITALS — BP 118/70 | HR 82 | Temp 98.8°F | Wt 155.2 lb

## 2021-12-26 DIAGNOSIS — Z136 Encounter for screening for cardiovascular disorders: Secondary | ICD-10-CM | POA: Diagnosis not present

## 2021-12-26 DIAGNOSIS — Z13220 Encounter for screening for lipoid disorders: Secondary | ICD-10-CM

## 2021-12-26 DIAGNOSIS — Z1211 Encounter for screening for malignant neoplasm of colon: Secondary | ICD-10-CM

## 2021-12-26 DIAGNOSIS — E669 Obesity, unspecified: Secondary | ICD-10-CM

## 2021-12-26 DIAGNOSIS — Z1231 Encounter for screening mammogram for malignant neoplasm of breast: Secondary | ICD-10-CM

## 2021-12-26 LAB — CBC WITH DIFFERENTIAL/PLATELET
Basophils Absolute: 0.1 10*3/uL (ref 0.0–0.1)
Basophils Relative: 1.1 % (ref 0.0–3.0)
Eosinophils Absolute: 0.1 10*3/uL (ref 0.0–0.7)
Eosinophils Relative: 1.3 % (ref 0.0–5.0)
HCT: 39.6 % (ref 36.0–46.0)
Hemoglobin: 13.4 g/dL (ref 12.0–15.0)
Lymphocytes Relative: 31.1 % (ref 12.0–46.0)
Lymphs Abs: 1.7 10*3/uL (ref 0.7–4.0)
MCHC: 33.7 g/dL (ref 30.0–36.0)
MCV: 91.1 fl (ref 78.0–100.0)
Monocytes Absolute: 0.4 10*3/uL (ref 0.1–1.0)
Monocytes Relative: 6.6 % (ref 3.0–12.0)
Neutro Abs: 3.4 10*3/uL (ref 1.4–7.7)
Neutrophils Relative %: 59.9 % (ref 43.0–77.0)
Platelets: 264 10*3/uL (ref 150.0–400.0)
RBC: 4.35 Mil/uL (ref 3.87–5.11)
RDW: 13.1 % (ref 11.5–15.5)
WBC: 5.6 10*3/uL (ref 4.0–10.5)

## 2021-12-26 LAB — COMPREHENSIVE METABOLIC PANEL
ALT: 13 U/L (ref 0–35)
AST: 14 U/L (ref 0–37)
Albumin: 4.3 g/dL (ref 3.5–5.2)
Alkaline Phosphatase: 52 U/L (ref 39–117)
BUN: 14 mg/dL (ref 6–23)
CO2: 25 mEq/L (ref 19–32)
Calcium: 9.3 mg/dL (ref 8.4–10.5)
Chloride: 107 mEq/L (ref 96–112)
Creatinine, Ser: 0.86 mg/dL (ref 0.40–1.20)
GFR: 81.91 mL/min (ref >60.00)
Glucose, Bld: 83 mg/dL (ref 70–99)
Potassium: 4.2 mEq/L (ref 3.5–5.1)
Sodium: 138 mEq/L (ref 135–145)
Total Bilirubin: 0.7 mg/dL (ref 0.2–1.2)
Total Protein: 6.9 g/dL (ref 6.0–8.3)

## 2021-12-26 LAB — LIPID PANEL
Cholesterol: 144 mg/dL (ref 0–200)
HDL: 50.5 mg/dL (ref >39.00)
LDL Cholesterol: 85 mg/dL (ref 0–99)
NonHDL: 93.92
Total CHOL/HDL Ratio: 3
Triglycerides: 47 mg/dL (ref 0.0–149.0)
VLDL: 9.4 mg/dL (ref 0.0–40.0)

## 2021-12-26 LAB — TSH: TSH: 1.82 u[IU]/mL (ref 0.35–5.50)

## 2021-12-26 LAB — HEMOGLOBIN A1C: Hgb A1c MFr Bld: 5.1 % (ref 4.6–6.5)

## 2021-12-26 NOTE — Patient Instructions (Signed)
Call to schedule your mammogram in [facility]  Referral to colonoscopy  Let us know if you dont hear back within a week in regards to an appointment being scheduled.   So that you are aware, if you are [facility] MyChart user , please pay attention to your MyChart messages as you may receive a MyChart message with a phone number to call and schedule this test/appointment own your own from our referral coordinator. This is a new process so I do not want you to miss this message.  If you are not a MyChart user, you will receive a phone call.

## 2021-12-26 NOTE — Progress Notes (Signed)
Assessment & Plan:  Encounter for screening mammogram for malignant neoplasm of breast -     3D Screening Mammogram, Left and Right; Future  Screen for colon cancer -     Ambulatory referral to Gastroenterology  Obesity, Class II, BMI 35-39.9, isolated Assessment & Plan: Achieved normal BMI of 24.  Congratulated her on success in her journey.  Advised maintenance appropriate and I would prefer not to see any further weight loss due to safety.  She will continue Wegovy 1 mg however if continues to lose weight then we will opt we will decrease dose.  May consider ZepBound in the new year.  Orders: -     Comprehensive metabolic panel -     CBC with Differential/Platelet -     TSH -     Hemoglobin A1c -     Lipid panel  Encounter for lipid screening for cardiovascular disease -     Hemoglobin A1c -     Lipid panel     Return precautions given.   Risks, benefits, and alternatives of the medications and treatment plan prescribed today were discussed, and patient expressed understanding.   Education regarding symptom management and diagnosis given to patient on AVS either electronically or printed.  No follow-ups on file.  [name], FNP  Subjective:    Patient ID: Aimee Medina, female    DOB: 1975/09/24, 46 y.o.   MRN: [id number]  CC: Aimee Medina is a 46 y.o. female who presents today for follow up.   HPI: HPI Feels well today.  No new complaints. Compliant with wegovy '1mg'$ .  She has been very pleased with [facility] and like to continue for maintenance.  She has lost approximately 100 pounds.  She feels this medication has been life-changing.  Fatigue has improved.  She notes her relationships have improved.  She feels inspired to eat healthier and workout.     Allergies: Patient has no known allergies. Current Outpatient Medications on File Prior to Visit  Medication Sig Dispense Refill   cabergoline (DOSTINEX) 0.5 MG tablet Take 1 tablet (0.5 mg total) by mouth twice a  week 24 tablet 3   cabergoline (DOSTINEX) 0.5 MG tablet Take 1 tablet (0.5 mg total) by mouth twice a week 24 tablet 3   Semaglutide-Weight Management (WEGOVY) 1 MG/0.5ML SOAJ Inject 1 mg into the skin once a week. 2 mL 3   No current facility-administered medications on file prior to visit.    Review of Systems  Constitutional:  Negative for chills and fever.  Respiratory:  Negative for cough.   Cardiovascular:  Negative for chest pain and palpitations.  Gastrointestinal:  Negative for nausea and vomiting.      Objective:    BP 118/70   Pulse 82   Temp 98.8 F (37.1 C) (Oral)   Wt 155 lb 3.2 oz (70.4 kg)   LMP 12/01/2021   SpO2 99%   BMI 24.31 kg/m  BP Readings from Last 3 Encounters:  12/26/21 118/70  06/28/21 112/72  03/14/21 122/79   Wt Readings from Last 3 Encounters:  12/26/21 155 lb 3.2 oz (70.4 kg)  06/28/21 175 lb 9.6 oz (79.7 kg)  03/14/21 200 lb (90.7 kg)    Physical Exam Vitals reviewed.  Constitutional:      Appearance: She is well-developed.  Eyes:     Conjunctiva/sclera: Conjunctivae normal.  Cardiovascular:     Rate and Rhythm: Normal rate and regular rhythm.     Pulses: Normal pulses.  Heart sounds: Normal heart sounds.  Pulmonary:     Effort: Pulmonary effort is normal.     Breath sounds: Normal breath sounds. No wheezing, rhonchi or rales.  Skin:    General: Skin is warm and dry.  Neurological:     Mental Status: She is alert.  Psychiatric:        Speech: Speech normal.        Behavior: Behavior normal.        Thought Content: Thought content normal.

## 2021-12-26 NOTE — Assessment & Plan Note (Addendum)
Achieved normal BMI of 24.  Congratulated her on success in her journey.  Advised maintenance appropriate and I would prefer not to see any further weight loss due to safety.  She will continue Wegovy 1 mg however if continues to lose weight then we will opt we will decrease dose.  May consider ZepBound in the new year.

## 2022-01-09 ENCOUNTER — Encounter: Payer: Self-pay | Admitting: Gastroenterology

## 2022-01-25 ENCOUNTER — Other Ambulatory Visit: Payer: Self-pay

## 2022-01-26 ENCOUNTER — Other Ambulatory Visit: Payer: Self-pay

## 2022-01-29 ENCOUNTER — Other Ambulatory Visit: Payer: Self-pay

## 2022-01-29 ENCOUNTER — Ambulatory Visit (AMBULATORY_SURGERY_CENTER): Payer: 59

## 2022-01-29 VITALS — Ht 67.0 in | Wt 155.0 lb

## 2022-01-29 DIAGNOSIS — Z1211 Encounter for screening for malignant neoplasm of colon: Secondary | ICD-10-CM

## 2022-01-29 MED ORDER — NA SULFATE-K SULFATE-MG SULF 17.5-3.13-1.6 GM/177ML PO SOLN
1.0000 | Freq: Once | ORAL | 0 refills | Status: AC
  Filled 2022-01-29: qty 354, 1d supply, fill #0

## 2022-01-29 NOTE — Progress Notes (Signed)

## 2022-02-06 IMAGING — MG MM DIGITAL SCREENING BILAT W/ TOMO AND CAD
8 series · 8 of 24 positions shown · non-contrast
Comparison: Previous exam(s).

CLINICAL DATA: Screening.

EXAM:
DIGITAL SCREENING BILATERAL MAMMOGRAM WITH TOMOSYNTHESIS AND CAD
TECHNIQUE: Bilateral screening digital craniocaudal and mediolateral oblique
mammograms were obtained. Bilateral screening digital breast
tomosynthesis was performed. The images were evaluated with
computer-aided detection.

[R MLO synth-2D]
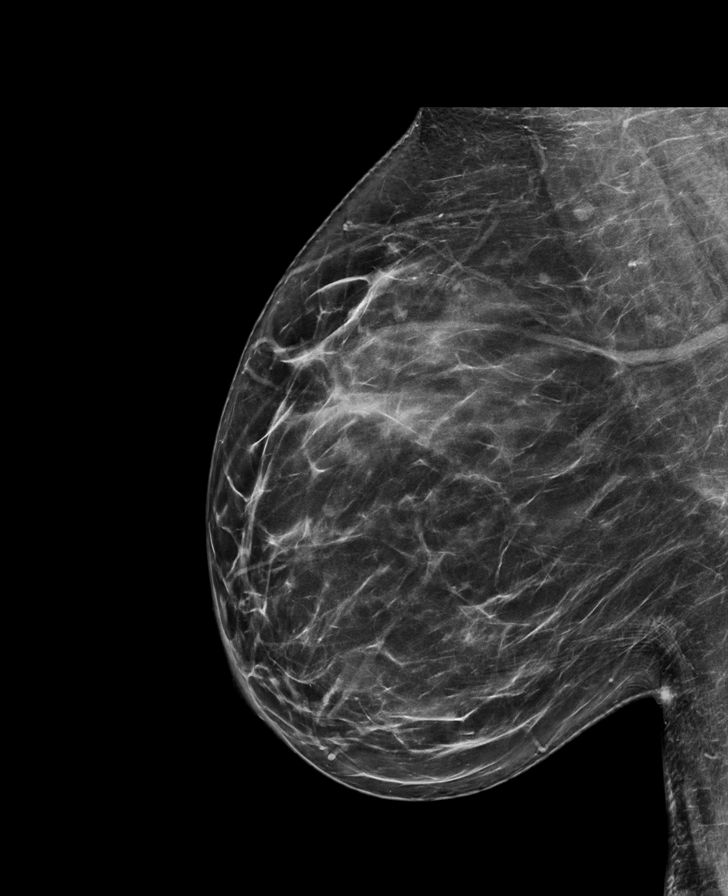

[R CC synth-2D]
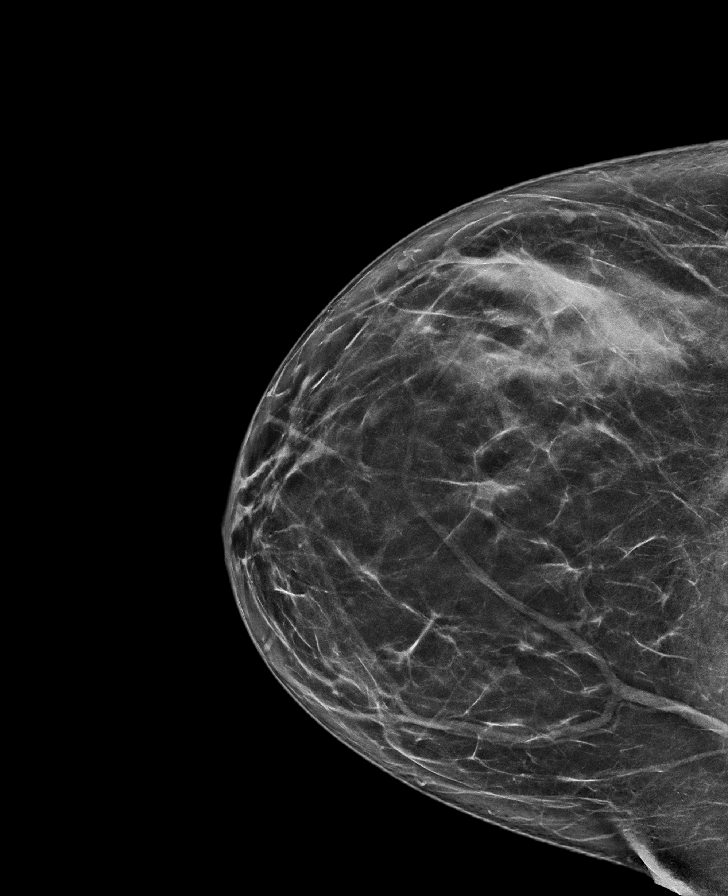

[L CC synth-2D]
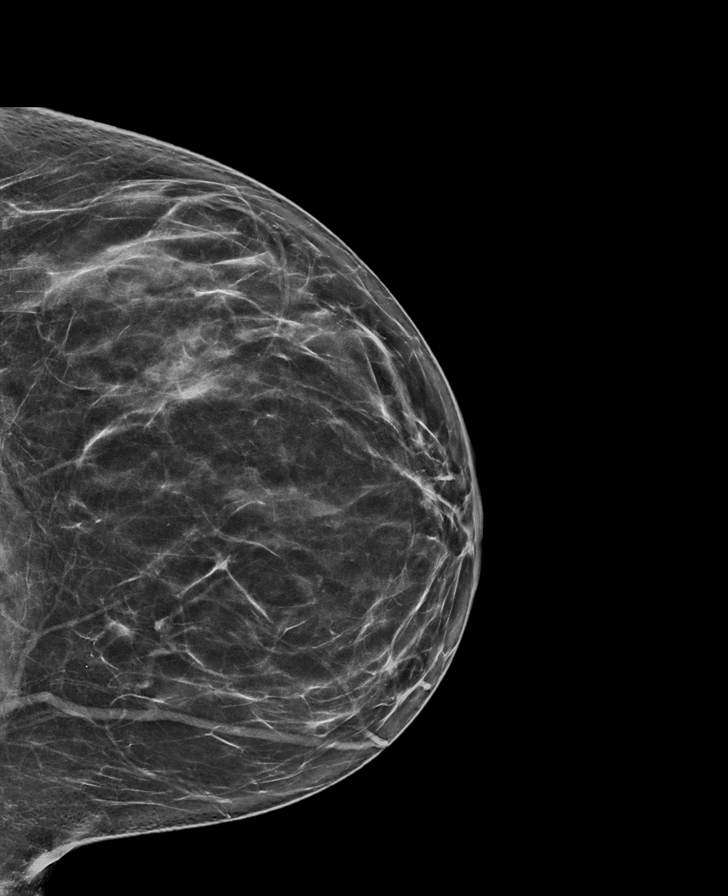

[L MLO synth-2D]
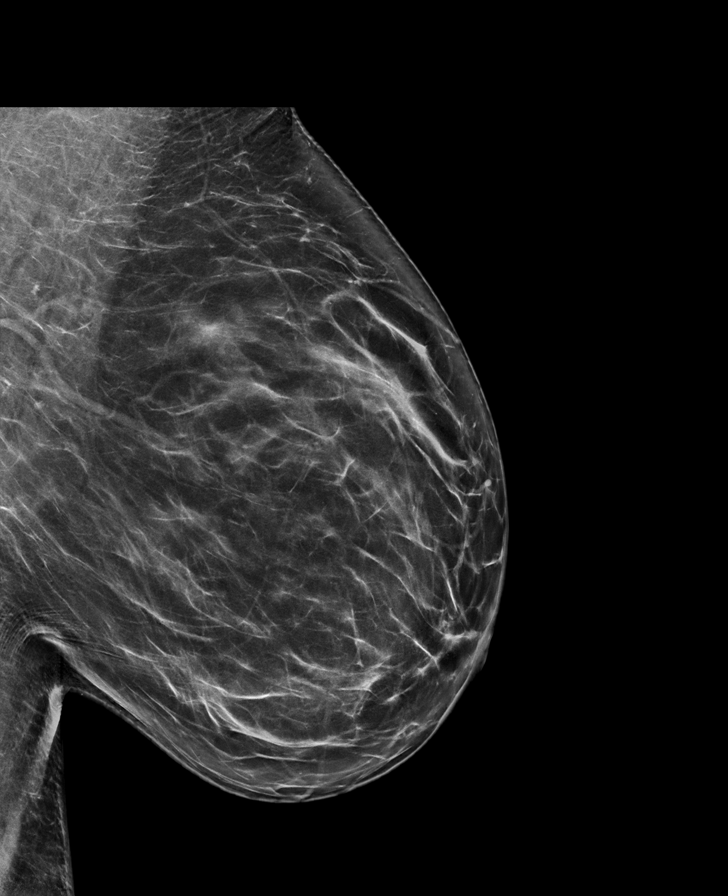

[L CC tomo · tomo slice 38/75.0]
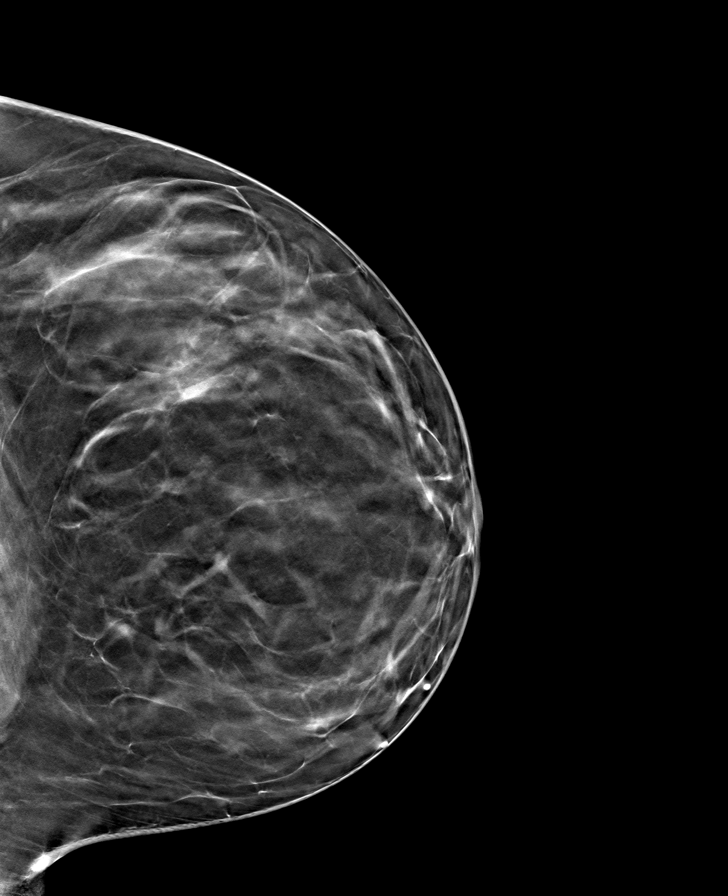

[R CC tomo · tomo slice 38/75.0]
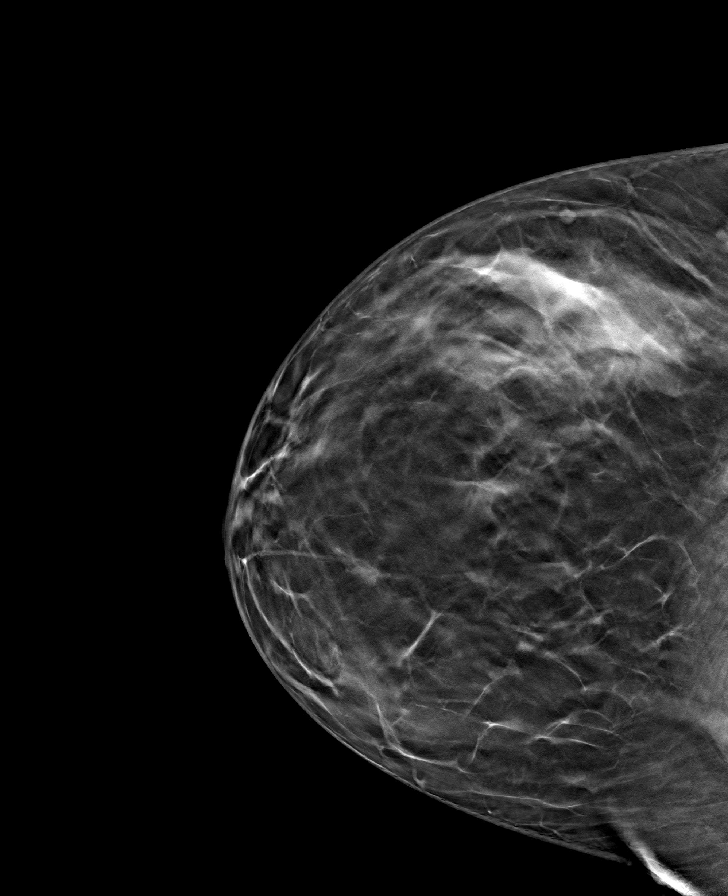

[L MLO tomo · tomo slice 43/86.0]
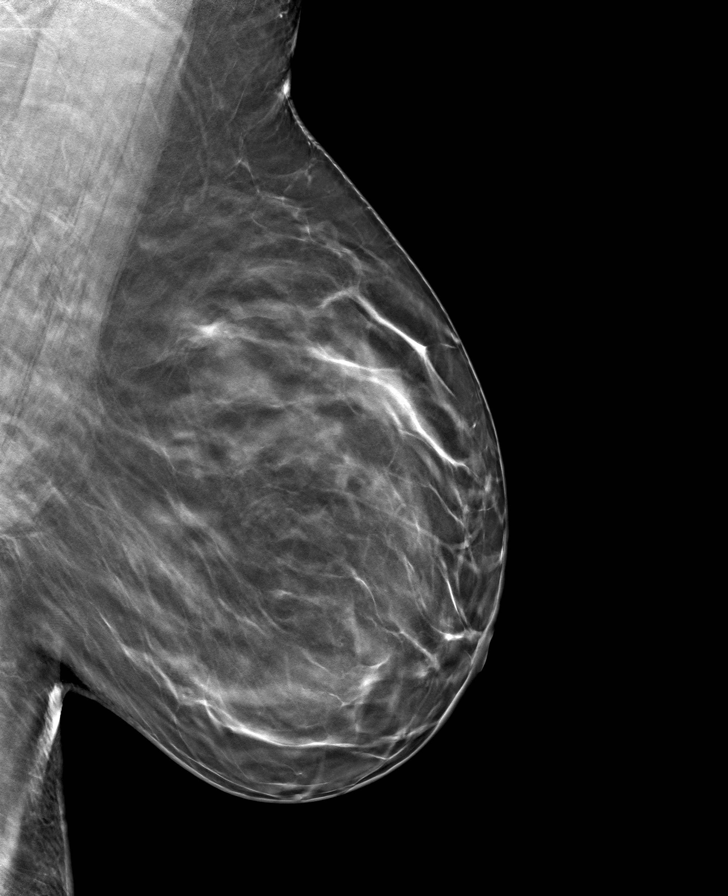

[R MLO tomo · tomo slice 42/83.0]
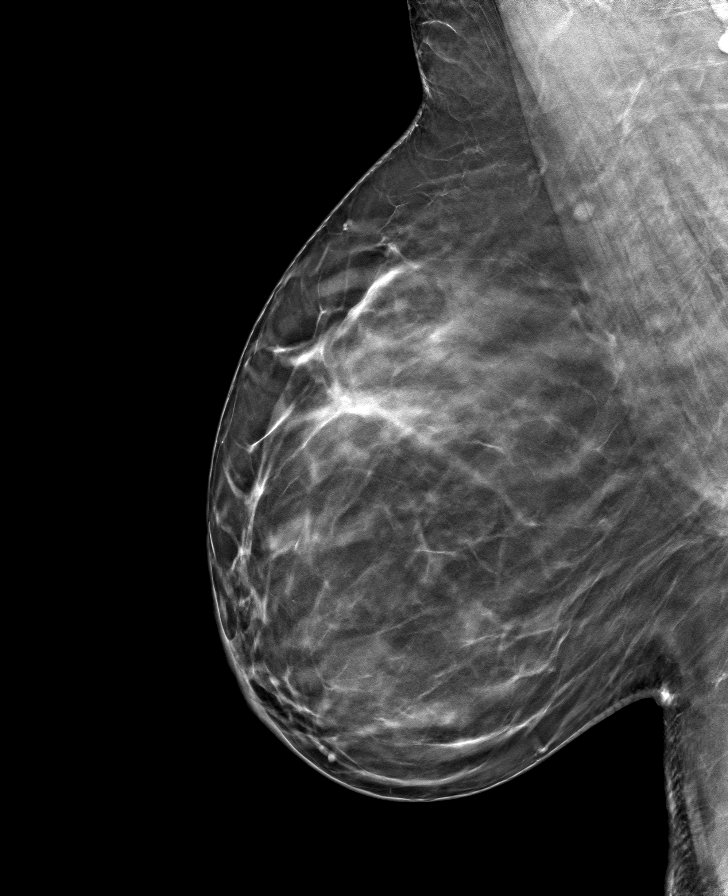

[8 of 24 positions shown; findings below may reference images not displayed]

ACR Breast Density Category c: The breast tissue is heterogeneously
dense, which may obscure small masses.
FINDINGS: There are no findings suspicious for malignancy.
IMPRESSION: No mammographic evidence of malignancy. A result letter of this
screening mammogram will be mailed directly to the patient.

RECOMMENDATION:
Screening mammogram in one year. (Code:Q3-W-BC3)

BI-RADS CATEGORY  1: Negative.

## 2022-02-14 ENCOUNTER — Telehealth: Payer: 59 | Admitting: Physician Assistant

## 2022-02-14 ENCOUNTER — Other Ambulatory Visit: Payer: Self-pay

## 2022-02-14 ENCOUNTER — Encounter: Payer: Self-pay | Admitting: Gastroenterology

## 2022-02-14 DIAGNOSIS — B3731 Acute candidiasis of vulva and vagina: Secondary | ICD-10-CM | POA: Diagnosis not present

## 2022-02-14 MED ORDER — FLUCONAZOLE 150 MG PO TABS
150.0000 mg | ORAL_TABLET | ORAL | 0 refills | Status: DC | PRN
  Filled 2022-02-14: qty 2, 6d supply, fill #0

## 2022-02-14 NOTE — Progress Notes (Signed)
E-Visit for Vaginal Symptoms  We are sorry that you are not feeling well. Here is how we plan to help! Based on what you shared with me it looks like you: May have a yeast vaginosis  Vaginosis is an inflammation of the vagina that can result in discharge, itching and pain. The cause is usually a change in the normal balance of vaginal bacteria or an infection. Vaginosis can also result from reduced estrogen levels after menopause.  The most common causes of vaginosis are:   Bacterial vaginosis which results from an overgrowth of one on several organisms that are normally present in your vagina.   Yeast infections which are caused by a naturally occurring fungus called candida.   Vaginal atrophy (atrophic vaginosis) which results from the thinning of the vagina from reduced estrogen levels after menopause.   Trichomoniasis which is caused by a parasite and is commonly transmitted by sexual intercourse.  Factors that increase your risk of developing vaginosis include: Medications, such as antibiotics and steroids Uncontrolled diabetes Use of hygiene products such as bubble bath, vaginal spray or vaginal deodorant Douching Wearing damp or tight-fitting clothing Using an intrauterine device (IUD) for birth control Hormonal changes, such as those associated with pregnancy, birth control pills or menopause Sexual activity Having a sexually transmitted infection  Your treatment plan is Diflucan (fluconazole) '150mg'$  tablet once, may repeat in 3 days if needed.  I have electronically sent this prescription into the pharmacy that you have chosen.  Be sure to take all of the medication as directed. Stop taking any medication if you develop a rash, tongue swelling or shortness of breath. Mothers who are breast feeding should consider pumping and discarding their breast milk while on these antibiotics. However, there is no consensus that infant exposure at these doses would be harmful.  Remember  that medication creams can weaken latex condoms. Marland Kitchen   HOME CARE:  Good hygiene may prevent some types of vaginosis from recurring and may relieve some symptoms:  Avoid baths, hot tubs and whirlpool spas. Rinse soap from your outer genital area after a shower, and dry the area well to prevent irritation. Don't use scented or harsh soaps, such as those with deodorant or antibacterial action. Avoid irritants. These include scented tampons and pads. Wipe from front to back after using the toilet. Doing so avoids spreading fecal bacteria to your vagina.  Other things that may help prevent vaginosis include:  Don't douche. Your vagina doesn't require cleansing other than normal bathing. Repetitive douching disrupts the normal organisms that reside in the vagina and can actually increase your risk of vaginal infection. Douching won't clear up a vaginal infection. Use a latex condom. Both female and female latex condoms may help you avoid infections spread by sexual contact. Wear cotton underwear. Also wear pantyhose with a cotton crotch. If you feel comfortable without it, skip wearing underwear to bed. Yeast thrives in [employer] Your symptoms should improve in the next day or two.  GET HELP RIGHT AWAY IF:  You have pain in your lower abdomen ( pelvic area or over your ovaries) You develop nausea or vomiting You develop a fever Your discharge changes or worsens You have persistent pain with intercourse You develop shortness of breath, a rapid pulse, or you faint.  These symptoms could be signs of problems or infections that need to be evaluated by a medical provider now.  MAKE SURE YOU   Understand these instructions. Will watch your condition. Will get help right  away if you are not doing well or get worse.  Thank you for choosing an e-visit.  Your e-visit answers were reviewed by a board certified advanced clinical practitioner to complete your personal care plan. Depending  upon the condition, your plan could have included both over the counter or prescription medications.  Please review your pharmacy choice. Make sure the pharmacy is open so you can pick up prescription now. If there is a problem, you may contact your provider through MyChart messaging and have the prescription routed to another pharmacy.  Your safety is important to us. If you have drug allergies check your prescription carefully.   For the next 24 hours you can use MyChart to ask questions about today's visit, request a non-urgent call back, or ask for a work or school excuse. You will get an email in the next two days asking about your experience. I hope that your e-visit has been valuable and will speed your recovery.  I have spent 5 minutes in review of e-visit questionnaire, review and updating patient chart, medical decision making and response to patient.   Aiyah Scarpelli M Carrell Rahmani, PA-C  

## 2022-02-18 ENCOUNTER — Encounter: Payer: Self-pay | Admitting: Certified Registered Nurse Anesthetist

## 2022-02-19 ENCOUNTER — Ambulatory Visit
Admission: RE | Admit: 2022-02-19 | Discharge: 2022-02-19 | Disposition: A | Discharge status: Home / Self Care | Payer: 59 | Source: Ambulatory Visit | Attending: Family | Admitting: Family

## 2022-02-19 DIAGNOSIS — Z1231 Encounter for screening mammogram for malignant neoplasm of breast: Secondary | ICD-10-CM | POA: Diagnosis not present

## 2022-02-23 ENCOUNTER — Encounter: Payer: Self-pay | Admitting: Gastroenterology

## 2022-02-23 ENCOUNTER — Ambulatory Visit (AMBULATORY_SURGERY_CENTER): Payer: 59 | Admitting: Gastroenterology

## 2022-02-23 VITALS — BP 121/80 | HR 95 | Temp 97.5°F | Resp 16 | Ht 67.0 in | Wt 153.6 lb

## 2022-02-23 DIAGNOSIS — Z1211 Encounter for screening for malignant neoplasm of colon: Secondary | ICD-10-CM

## 2022-02-23 DIAGNOSIS — K6289 Other specified diseases of anus and rectum: Secondary | ICD-10-CM | POA: Diagnosis not present

## 2022-02-23 DIAGNOSIS — K633 Ulcer of intestine: Secondary | ICD-10-CM

## 2022-02-23 MED ORDER — AMBULATORY NON FORMULARY MEDICATION: 0 refills | Status: DC

## 2022-02-23 MED ORDER — SODIUM CHLORIDE 0.9 % IV SOLN: 500.0000 mL | Freq: Once | INTRAVENOUS | Status: DC

## 2022-02-23 NOTE — Progress Notes (Signed)
1130 HR > 100 with esmolol 25 mg given IV, MD updated, vss

## 2022-02-23 NOTE — Progress Notes (Signed)
1123 Robinul 0.2 mg IV given due large amount of secretions upon assessment.  MD made aware, vss

## 2022-02-23 NOTE — Progress Notes (Signed)
Report given to PACU, vss 

## 2022-02-23 NOTE — Progress Notes (Signed)
Pt's states no medical or surgical changes since previsit or office visit. 

## 2022-02-23 NOTE — Patient Instructions (Addendum)
Recommend Diltiazen /Lidocaine topical ointment pea size amount rectally three times a day. This medication will come from [facility] have to take Rx there and they will let you know when it is ready for pick up   Surgery consult will be made regarding peri anal findings - this will be made by Dr Doyne Keel office   [place], [place],  [address] [address] previous diet and medications     YOU HAD AN ENDOSCOPIC PROCEDURE TODAY AT [facility]:   Refer to the procedure report that was given to you for any specific questions about what was found during the examination.  If the procedure report does not answer your questions, please call your gastroenterologist to clarify.  If you requested that your care partner not be given the details of your procedure findings, then the procedure report has been included in a sealed envelope for you to review at your convenience later.  YOU SHOULD EXPECT: Some feelings of bloating in the abdomen. Passage of more gas than usual.  Walking can help get rid of the air that was put into your GI tract during the procedure and reduce the bloating. If you had a lower endoscopy (such as a colonoscopy or flexible sigmoidoscopy) you may notice spotting of blood in your stool or on the toilet paper. If you underwent a bowel prep for your procedure, you may not have a normal bowel movement for a few days.  Please Note:  You might notice some irritation and congestion in your nose or some drainage.  This is from the oxygen used during your procedure.  There is no need for concern and it should clear up in a day or so.  SYMPTOMS TO REPORT IMMEDIATELY:  Following lower endoscopy (colonoscopy or flexible sigmoidoscopy):  Excessive amounts of blood in the stool  Significant tenderness or worsening of abdominal pains  Swelling of the abdomen that is new, acute  Fever of 100F or higher  For urgent or  emergent issues, a gastroenterologist can be reached at any hour by calling [phone]. Do not use MyChart messaging for urgent concerns.    DIET:  We do recommend a small meal at first, but then you may proceed to your regular diet.  Drink plenty of fluids but you should avoid alcoholic beverages for 24 hours.  ACTIVITY:  You should plan to take it easy for the rest of today and you should NOT DRIVE or use heavy machinery until tomorrow (because of the sedation medicines used during the test).    FOLLOW UP: Our staff will call the number listed on your records the next business day following your procedure.  We will call around 7:15- 8:00 am to check on you and address any questions or concerns that you may have regarding the information given to you following your procedure. If we do not reach you, we will leave a message.     If any biopsies were taken you will be contacted by phone or by letter within the next 1-3 weeks.  Please call us at [phone] if you have not heard about the biopsies in 3 weeks.    SIGNATURES/CONFIDENTIALITY: You and/or your care partner have signed paperwork which will be entered into your electronic medical record.  These signatures attest to the fact that that the information above on your After Visit Summary has been reviewed and is understood.  Full responsibility of the confidentiality  of this discharge information lies with you and/or your care-partner.

## 2022-02-23 NOTE — Progress Notes (Signed)
[facility] History and Physical   Primary Care Physician:  Burnard Hawthorne, FNP   Reason for Procedure:   Colon cancer screening  Plan:    colonoscopy     HPI: Aimee Medina is a 47 y.o. female  here for colonoscopy screening - first time exam.    Patient denies any bowel symptoms at this time. Grandmother had colon cancer. No first degree family history of colon cancer known. Otherwise feels well without any cardiopulmonary symptoms.   I have discussed risks / benefits of anesthesia and endoscopic procedure with Andria Frames Vallez and they wish to proceed with the exams as outlined today.    Past Medical History:  Diagnosis Date   Allergy    Dermatitis    Obesity    Perennial allergic rhinitis    Positive ANA (antinuclear antibody) 11/27/2019   Consult with dr patel rheumatology 11/21 and felt false positive ana.    Psoriasis    Seasonal and perennial allergic rhinitis 01/21/2015   Vitamin D deficiency 03/27/2017    Past Surgical History:  Procedure Laterality Date   ABDOMINOPLASTY  2015   APPENDECTOMY  2011    Prior to Admission medications   Medication Sig Start Date End Date Taking? Authorizing Provider  cabergoline (DOSTINEX) 0.5 MG tablet Take 1 tablet (0.5 mg total) by mouth twice a week 03/07/21     cabergoline (DOSTINEX) 0.5 MG tablet Take 1 tablet (0.5 mg total) by mouth 2 (two) times a week. 09/07/21     fluconazole (DIFLUCAN) 150 MG tablet Take 1 tablet (150 mg total) by mouth every 3 (three) days as needed. 02/14/22   Mar Daring, PA-C  Semaglutide-Weight Management (WEGOVY) 1 MG/0.5ML SOAJ Inject 1 mg into the skin once a week. 10/31/21   Burnard Hawthorne, FNP    Current Outpatient Medications  Medication Sig Dispense Refill   cabergoline (DOSTINEX) 0.5 MG tablet Take 1 tablet (0.5 mg total) by mouth twice a week 24 tablet 3   cabergoline (DOSTINEX) 0.5 MG tablet Take 1 tablet (0.5 mg total) by mouth 2 (two) times a week. 24 tablet 3    fluconazole (DIFLUCAN) 150 MG tablet Take 1 tablet (150 mg total) by mouth every 3 (three) days as needed. 2 tablet 0   Semaglutide-Weight Management (WEGOVY) 1 MG/0.5ML SOAJ Inject 1 mg into the skin once a week. 2 mL 3   Current Facility-Administered Medications  Medication Dose Route Frequency Provider Last Rate Last Admin   0.9 %  sodium chloride infusion  500 mL Intravenous Once Indianna Boran, Carlota Raspberry, MD        Allergies as of 02/23/2022   (No Known Allergies)    Family History  Problem Relation Age of Onset   High blood pressure Mother    Heart disease Mother    Stroke Mother        brain aneursym   Hypertension Father    Stroke Father    Prostate cancer Father 59       prostate- currently in remission   High Cholesterol Father    Pancreatic cancer Maternal Grandmother 1   Colon cancer Maternal Grandfather 69   Prostate cancer Paternal Grandfather 68   Allergies Daughter    Allergies Son    Breast cancer Neg Hx    Thyroid cancer Neg Hx    Colon polyps Neg Hx    Esophageal cancer Neg Hx    Stomach cancer Neg Hx    Rectal cancer Neg Hx  Social History   Socioeconomic History   Marital status: Married    Spouse name: Leone Payor   Number of children: 2   Years of education: bachelors   Highest education level: Not on file  Occupational History   Occupation: Acupuncturist  Tobacco Use   Smoking status: Never   Smokeless tobacco: Never  Vaping Use   Vaping Use: Never used  Substance and Sexual Activity   Alcohol use: No    Alcohol/week: 0.0 standard drinks of alcohol    Comment: very rare   Drug use: No   Sexual activity: Yes    Partners: Male    Birth control/protection: None  Other Topics Concern   Not on file  Social History Narrative   Married.   Works at [employer]   2 children, daughter 57, son 83.    Daughter verbally committed to [employer] college for basketball for 2025.    Enjoys relaxing, pedicures, spending time with family.     Social Determinants of Health   Financial Resource Strain: Low Risk  (01/14/2017)   Overall Financial Resource Strain (CARDIA)    Difficulty of Paying Living Expenses: Not hard at all  Food Insecurity: No Food Insecurity (01/14/2017)   Hunger Vital Sign    Worried About Running Out of Food in the Last Year: Never true    Ran Out of Food in the Last Year: Never true  Transportation Needs: No Transportation Needs (01/14/2017)   PRAPARE - Hydrologist (Medical): No    Lack of Transportation (Non-Medical): No  Physical Activity: Insufficiently Active (01/14/2017)   Exercise Vital Sign    Days of Exercise per Week: 1 day    Minutes of Exercise per Session: 40 min  Stress: No Stress Concern Present (01/14/2017)   [place]    Feeling of Stress : Only a little  Social Connections: Socially Integrated (01/14/2017)   Social Connection and Isolation Panel [NHANES]    Frequency of Communication with Friends and Family: More than three times a week    Frequency of Social Gatherings with Friends and Family: Three times a week    Attends Religious Services: More than 4 times per year    Active Member of Clubs or Organizations: Yes    Attends Archivist Meetings: More than 4 times per year    Marital Status: Married  Human resources officer Violence: Not At Risk (01/14/2017)   Humiliation, Afraid, Rape, and Kick questionnaire    Fear of Current or Ex-Partner: No    Emotionally Abused: No    Physically Abused: No    Sexually Abused: No    Review of Systems: All other review of systems negative except as mentioned in the HPI.  Physical Exam: Vital signs BP (!) 101/59   Pulse 77   Temp (!) 97.5 F (36.4 C) (Skin)   Ht 5' 7"$  (1.702 m)   Wt 153 lb 9.6 oz (69.7 kg)   SpO2 100%   BMI 24.06 kg/m   General:   Alert,  Well-developed, pleasant and cooperative in NAD Lungs:  Clear throughout  to auscultation.   Heart:  Regular rate and rhythm Abdomen:  Soft, nontender and nondistended.   Neuro/Psych:  Alert and cooperative. Normal mood and affect. A and O x 3  Jolly Mango, MD [facility]

## 2022-02-23 NOTE — Op Note (Signed)
[facility] Patient Name: Aimee Medina Procedure Date: 02/23/2022 10:45 AM MRN: [id number] Endoscopist: Remo Lipps P. Havery Moros , MD, [id number] Age: 47 Referring MD:  Date of Birth: 08/23/1975 Gender: Female Account #: [id number] Procedure:                Colonoscopy Indications:              Screening for colorectal malignant neoplasm, This                            is the patient's first colonoscopy Medicines:                Monitored Anesthesia Care Procedure:                Pre-Anesthesia Assessment:                           - Prior to the procedure, a History and Physical                            was performed, and patient medications and                            allergies were reviewed. The patient's tolerance of                            previous anesthesia was also reviewed. The risks                            and benefits of the procedure and the sedation                            options and risks were discussed with the patient.                            All questions were answered, and informed consent                            was obtained. Prior Anticoagulants: The patient has                            taken no anticoagulant or antiplatelet agents. ASA                            Grade Assessment: II - A patient with mild systemic                            disease. After reviewing the risks and benefits,                            the patient was deemed in satisfactory condition to                            undergo the procedure.  After obtaining informed consent, the colonoscope                            was passed under direct vision. Throughout the                            procedure, the patient's blood pressure, pulse, and                            oxygen saturations were monitored continuously. The                            Olympus PCF-H190DL [id number]) Colonoscope was                            introduced through the  anus and advanced to the the                            cecum, identified by appendiceal orifice and                            ileocecal valve. The colonoscopy was performed                            without difficulty. The patient tolerated the                            procedure well. The quality of the bowel                            preparation was adequate. The ileocecal valve,                            appendiceal orifice, and rectum were photographed. Scope In: 11:25:33 AM Scope Out: 11:47:04 AM Scope Withdrawal Time: 0 hours 17 minutes 20 seconds  Total Procedure Duration: 0 hours 21 minutes 31 seconds  Findings:                 Large skin tags were found on perianal exam, one                            with an attached somewhat polypoid appearing lesion.                           A large amount of liquid stool was found in the                            entire colon, making visualization difficult.                            Lavage of the colon was performed using copious                            amounts of sterile water, resulting in clearance  with adequate visualization. This prolonged the                            exam.                           Anal papilla(e) were hypertrophied. Biopsies were                            taken with a cold forceps for histology to rule out                            AIN.                           There were multiple areas of ulceration below the                            dentate line in the anal canal - fissures possible                            but not typical appearance. The exam was otherwise                            without abnormality. Complications:            No immediate complications. Estimated blood loss:                            Minimal. Estimated Blood Loss:     Estimated blood loss was minimal. Impression:               - Large perianal skin tags found on perianal exam,                             one with ? polypoid lesion.                           - Stool in the entire examined colon requiring                            extensive lavage.                           - Anal papilla(e) were hypertrophied. Biopsied to                            rule out AIN.                           - Ulceration below the dentate line along anal                            canal - not typical for fissure but possible.                           -  The examination was otherwise normal. Recommendation:           - Patient has a contact number available for                            emergencies. The signs and symptoms of potential                            delayed complications were discussed with the                            patient. Return to normal activities tomorrow.                            Written discharge instructions were provided to the                            patient.                           - Resume previous diet.                           - Continue present medications.                           - Recommend starting diltiazem / lidocaine topical                            ointment - pea sized amount PR TID for findings                            along dentate line                           - Await pathology results.                           - Colorectal surgery consult regarding perianal                            findings with possible polypoid lesion along large                            skin tags, and further evaluate perianal ulceration Khizar Fiorella P. Britteny Fiebelkorn, MD 02/23/2022 11:54:41 AM This report has been signed electronically.

## 2022-02-23 NOTE — Progress Notes (Signed)
1145 HR > 100 with esmolol 25 mg given IV, MD updated, vss

## 2022-02-23 NOTE — Progress Notes (Signed)
Called to room to assist during endoscopic procedure.  Patient ID and intended procedure confirmed with present staff. Received instructions for my participation in the procedure from the performing physician.  

## 2022-02-23 NOTE — Progress Notes (Signed)
1135 HR > 100 with esmolol 25 mg given IV, MD updated, vss

## 2022-02-23 NOTE — Progress Notes (Signed)
1125 HR > 100 with esmolol 25 mg given IV, MD updated, vss

## 2022-02-26 ENCOUNTER — Telehealth: Payer: Self-pay

## 2022-02-26 NOTE — Telephone Encounter (Signed)
Per 02/23/22 procedure report - Colorectal surgery consult regarding evaluation of perianal findings with possible polypoid lesion along large skin tags, and further evaluation of perianal ulceration.  Referral, records, pt's demographic, and insurance information have been faxed to [facility] at [phone].

## 2022-02-26 NOTE — Telephone Encounter (Signed)
  Follow up Call-     02/23/2022   10:48 AM  Call back number  Post procedure Call Back phone  # [phone]  Permission to leave phone message Yes     Patient questions:  Do you have a fever, pain , or abdominal swelling? No. Pain Score  0 *  Have you tolerated food without any problems? Yes.    Have you been able to return to your normal activities? Yes.    Do you have any questions about your discharge instructions: Diet   No. Medications  No. Follow up visit  No.  Do you have questions or concerns about your Care? No.  Actions: * If pain score is 4 or above: No action needed, pain <4.

## 2022-03-12 ENCOUNTER — Ambulatory Visit: Payer: Self-pay | Admitting: General Surgery

## 2022-03-12 DIAGNOSIS — K644 Residual hemorrhoidal skin tags: Secondary | ICD-10-CM | POA: Diagnosis not present

## 2022-03-12 NOTE — H&P (View-Only) (Signed)
REFERRING PHYSICIAN:  Manus Gunning*   PROVIDER:  Monico Blitz, MD   MRN: [id number] DOB: 11-27-1975 DATE OF ENCOUNTER: 03/12/2022   Subjective    Chief Complaint: New Consultation (Evaluation of Perianal Ulceration)       History of Present Illness: Aimee Medina is a 47 y.o. female who is seen today as an office consultation at the request of Dr. Havery Moros for evaluation of New Consultation (Evaluation of Perianal Ulceration) .  47 year old female who underwent recent colonoscopy.  She was noted to have a growth arising from a skin tag of the anus.  She was sent here for further evaluation.  She reports a patient of bleeding from the area.     Review of Systems: A complete review of systems was obtained from the patient.  I have reviewed this information and discussed as appropriate with the patient.  See HPI as well for other ROS.       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   Psoriasis 11/26/2019           Patient Active Problem List  Diagnosis   Eczema, unspecified   Prolactinoma (CMS-HCC)   ANA positive   Psoriasis      Past Surgical History       Past Surgical History:  Procedure Laterality Date   APPENDECTOMY   09/2009   abdominoplasty   2015        Allergies  No Known Allergies           Current Outpatient Medications on File Prior to Visit  Medication Sig Dispense Refill   cabergoline (DOSTINEX) 0.5 mg tablet Take 1 tablet (0.5 mg total) by mouth twice a week 24 tablet 3   dilTIAZem 2% topical gel Apply topically Diltiazem 2%/Lidocaine 5%       WEGOVY 1 mg/0.5 mL pen injector Inject subcutaneously        No current facility-administered medications on file prior to visit.      Family History       Family History  Problem  Relation Age of Onset   High blood pressure (Hypertension) Mother     Stroke Mother     Coronary Artery Disease (Blocked arteries around heart) Mother     Other Mother          DVT   High blood pressure (Hypertension) Father     Stroke Father     Prostate cancer Father     Pancreatic cancer Maternal Grandmother     Colon cancer Maternal Grandfather     Diabetes Maternal Aunt          Social History       Tobacco Use  Smoking Status Never  Smokeless Tobacco Never      Social History  Social History         Socioeconomic History   Marital status: Married  Occupational History   Occupation: Designer, industrial/product  Tobacco Use   Smoking status: Never   Smokeless tobacco: Never  Vaping Use   Vaping Use: Never used  Substance and Sexual Activity   Alcohol use: Yes   Drug use: No   Sexual activity: Yes      Partners: Male  Birth control/protection: None  Social History Narrative    SH:    Patient is married and lives with husband and daughter, born 49 and son, born 2009. Has bachelor's degree. 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 No guns. Not much exercise.        Objective:          Vitals:    03/12/22 1518 03/12/22 1519  BP: 129/77    Pulse: 73    Temp: 37.2 C (98.9 F)    SpO2: 98%    Weight: 70.3 kg (155 lb)    Height: 170.2 cm ('5\' 7"'$ )    PainSc:   0-No pain  PainLoc:   Rectum      Exam Gen: NAD Abd: Soft Rectal: Polypoid mass on left lateral skin tag consistent with inflammatory polyp.  Hypertrophied anal papilla internally.     Labs, Imaging and Diagnostic Testing: Colonoscopy report reviewed.   Assessment and Plan:  Diagnoses and all orders for this visit:   Anal skin tag     47 year old female with what appears to be an inflammatory polyp arising from a skin tag.  She also has hypertrophied anal papilla.  I recommended excision of both.  We discussed excising the inflammatory polyp separately as another  option.  She elected to proceed with excision of skin tag and anal papilla.  We will schedule this at her convenience.    Rosario Adie, MD Colon and Rectal Surgery [facility]

## 2022-03-12 NOTE — H&P (Signed)
REFERRING PHYSICIAN:  Manus Gunning*   PROVIDER:  Monico Blitz, MD   MRN: [id number] DOB: 1975/10/26 DATE OF ENCOUNTER: 03/12/2022   Subjective    Chief Complaint: New Consultation (Evaluation of Perianal Ulceration)       History of Present Illness: Aimee Medina is a 47 y.o. female who is seen today as an office consultation at the request of Dr. Havery Moros for evaluation of New Consultation (Evaluation of Perianal Ulceration) .  47 year old female who underwent recent colonoscopy.  She was noted to have a growth arising from a skin tag of the anus.  She was sent here for further evaluation.  She reports a patient of bleeding from the area.     Review of Systems: A complete review of systems was obtained from the patient.  I have reviewed this information and discussed as appropriate with the patient.  See HPI as well for other ROS.       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   Psoriasis 11/26/2019           Patient Active Problem List  Diagnosis   Eczema, unspecified   Prolactinoma (CMS-HCC)   ANA positive   Psoriasis      Past Surgical History       Past Surgical History:  Procedure Laterality Date   APPENDECTOMY   09/2009   abdominoplasty   2015        Allergies  No Known Allergies           Current Outpatient Medications on File Prior to Visit  Medication Sig Dispense Refill   cabergoline (DOSTINEX) 0.5 mg tablet Take 1 tablet (0.5 mg total) by mouth twice a week 24 tablet 3   dilTIAZem 2% topical gel Apply topically Diltiazem 2%/Lidocaine 5%       WEGOVY 1 mg/0.5 mL pen injector Inject subcutaneously        No current facility-administered medications on file prior to visit.      Family History       Family History  Problem  Relation Age of Onset   High blood pressure (Hypertension) Mother     Stroke Mother     Coronary Artery Disease (Blocked arteries around heart) Mother     Other Mother          DVT   High blood pressure (Hypertension) Father     Stroke Father     Prostate cancer Father     Pancreatic cancer Maternal Grandmother     Colon cancer Maternal Grandfather     Diabetes Maternal Aunt          Social History       Tobacco Use  Smoking Status Never  Smokeless Tobacco Never      Social History  Social History         Socioeconomic History   Marital status: Married  Occupational History   Occupation: Designer, industrial/product  Tobacco Use   Smoking status: Never   Smokeless tobacco: Never  Vaping Use   Vaping Use: Never used  Substance and Sexual Activity   Alcohol use: Yes   Drug use: No   Sexual activity: Yes      Partners: Male  Birth control/protection: None  Social History Narrative    SH:    Patient is married and lives with husband and daughter, born 54 and son, born 2009. Has bachelor's degree. 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 No guns. Not much exercise.        Objective:          Vitals:    03/12/22 1518 03/12/22 1519  BP: 129/77    Pulse: 73    Temp: 37.2 C (98.9 F)    SpO2: 98%    Weight: 70.3 kg (155 lb)    Height: 170.2 cm ('5\' 7"'$ )    PainSc:   0-No pain  PainLoc:   Rectum      Exam Gen: NAD Abd: Soft Rectal: Polypoid mass on left lateral skin tag consistent with inflammatory polyp.  Hypertrophied anal papilla internally.     Labs, Imaging and Diagnostic Testing: Colonoscopy report reviewed.   Assessment and Plan:  Diagnoses and all orders for this visit:   Anal skin tag     47 year old female with what appears to be an inflammatory polyp arising from a skin tag.  She also has hypertrophied anal papilla.  I recommended excision of both.  We discussed excising the inflammatory polyp separately as another  option.  She elected to proceed with excision of skin tag and anal papilla.  We will schedule this at her convenience.    Rosario Adie, MD Colon and Rectal Surgery [facility]

## 2022-03-13 ENCOUNTER — Ambulatory Visit: Payer: 59 | Admitting: Obstetrics & Gynecology

## 2022-03-15 ENCOUNTER — Encounter (HOSPITAL_BASED_OUTPATIENT_CLINIC_OR_DEPARTMENT_OTHER): Payer: Self-pay | Admitting: General Surgery

## 2022-03-16 ENCOUNTER — Other Ambulatory Visit: Payer: Self-pay

## 2022-03-16 ENCOUNTER — Encounter (HOSPITAL_BASED_OUTPATIENT_CLINIC_OR_DEPARTMENT_OTHER): Payer: Self-pay | Admitting: General Surgery

## 2022-03-16 NOTE — Progress Notes (Signed)
Spoke w/ via phone for pre-op interview---pt Lab needs dos---- UPT              Lab results------none COVID test -----patient states asymptomatic no test needed Arrive at -------0900 NPO after MN NO Solid Food.  Clear liquids from MN until---0800 Med rec completed Medications to take morning of surgery -----none Diabetic medication -----none Patient instructed no nail polish to be worn day of surgery Patient instructed to bring photo id and insurance card day of surgery Patient aware to have Driver (ride ) / husband Leone Payor caregiver    for 24 hours after surgery  Patient Special Instructions -----none Pre-Op special Istructions -----hold wegovy Patient verbalized understanding of instructions that were given at this phone interview. Patient denies shortness of breath, chest pain, fever, cough at this phone interview.    Pt stopped Wegovy on 03/02/2022

## 2022-03-21 ENCOUNTER — Ambulatory Visit (HOSPITAL_BASED_OUTPATIENT_CLINIC_OR_DEPARTMENT_OTHER): Payer: 59 | Admitting: Anesthesiology

## 2022-03-21 ENCOUNTER — Encounter (HOSPITAL_BASED_OUTPATIENT_CLINIC_OR_DEPARTMENT_OTHER): Payer: Self-pay | Admitting: General Surgery

## 2022-03-21 ENCOUNTER — Other Ambulatory Visit (HOSPITAL_COMMUNITY): Payer: Self-pay

## 2022-03-21 ENCOUNTER — Other Ambulatory Visit: Payer: Self-pay

## 2022-03-21 ENCOUNTER — Ambulatory Visit (HOSPITAL_BASED_OUTPATIENT_CLINIC_OR_DEPARTMENT_OTHER)
Admission: RE | Admit: 2022-03-21 | Discharge: 2022-03-21 | Disposition: A | Discharge status: Home / Self Care | Payer: 59 | Source: Ambulatory Visit | Attending: General Surgery | Admitting: General Surgery

## 2022-03-21 ENCOUNTER — Encounter (HOSPITAL_BASED_OUTPATIENT_CLINIC_OR_DEPARTMENT_OTHER)
Admission: RE | Disposition: A | Discharge status: Home / Self Care | Payer: Self-pay | Source: Ambulatory Visit | Attending: General Surgery

## 2022-03-21 DIAGNOSIS — K644 Residual hemorrhoidal skin tags: Secondary | ICD-10-CM | POA: Diagnosis not present

## 2022-03-21 DIAGNOSIS — L918 Other hypertrophic disorders of the skin: Secondary | ICD-10-CM | POA: Insufficient documentation

## 2022-03-21 DIAGNOSIS — Z419 Encounter for procedure for purposes other than remedying health state, unspecified: Secondary | ICD-10-CM

## 2022-03-21 DIAGNOSIS — K6289 Other specified diseases of anus and rectum: Secondary | ICD-10-CM | POA: Diagnosis not present

## 2022-03-21 DIAGNOSIS — K620 Anal polyp: Secondary | ICD-10-CM | POA: Diagnosis not present

## 2022-03-21 HISTORY — DX: Dermatitis, unspecified: L30.9

## 2022-03-21 HISTORY — PX: EXCISION OF SKIN TAG: SHX6270

## 2022-03-21 HISTORY — DX: Anal polyp: K62.0

## 2022-03-21 LAB — POCT PREGNANCY, URINE: Preg Test, Ur: NEGATIVE

## 2022-03-21 SURGERY — EXCISION, SKIN TAG
Anesthesia: Monitor Anesthesia Care

## 2022-03-21 MED ORDER — FENTANYL CITRATE (PF) 100 MCG/2ML IJ SOLN
INTRAMUSCULAR | Status: DC | PRN
  Administered 2022-03-21: 50 ug via INTRAVENOUS

## 2022-03-21 MED ORDER — FENTANYL CITRATE (PF) 100 MCG/2ML IJ SOLN: 25.0000 ug | INTRAMUSCULAR | Status: DC | PRN

## 2022-03-21 MED ORDER — ACETAMINOPHEN 500 MG PO TABS
1000.0000 mg | ORAL_TABLET | Freq: Once | ORAL | Status: AC
  Administered 2022-03-21: 1000 mg via ORAL

## 2022-03-21 MED ORDER — LIDOCAINE HCL (PF) 2 % IJ SOLN
INTRAMUSCULAR | Status: AC
  Filled 2022-03-21: qty 5

## 2022-03-21 MED ORDER — MIDAZOLAM HCL 2 MG/2ML IJ SOLN
INTRAMUSCULAR | Status: AC
  Filled 2022-03-21: qty 2

## 2022-03-21 MED ORDER — PROPOFOL 500 MG/50ML IV EMUL
INTRAVENOUS | Status: DC | PRN
  Administered 2022-03-21: 200 ug/kg/min via INTRAVENOUS

## 2022-03-21 MED ORDER — LIDOCAINE 2% (20 MG/ML) 5 ML SYRINGE
INTRAMUSCULAR | Status: DC | PRN
  Administered 2022-03-21: 60 mg via INTRAVENOUS

## 2022-03-21 MED ORDER — BUPIVACAINE LIPOSOME 1.3 % IJ SUSP
INTRAMUSCULAR | Status: DC | PRN
  Administered 2022-03-21: 20 mL

## 2022-03-21 MED ORDER — ACETAMINOPHEN 500 MG PO TABS
ORAL_TABLET | ORAL | Status: AC
  Filled 2022-03-21: qty 2

## 2022-03-21 MED ORDER — PROPOFOL 500 MG/50ML IV EMUL
INTRAVENOUS | Status: AC
  Filled 2022-03-21: qty 50

## 2022-03-21 MED ORDER — PROPOFOL 10 MG/ML IV BOLUS
INTRAVENOUS | Status: AC
  Filled 2022-03-21: qty 20

## 2022-03-21 MED ORDER — MIDAZOLAM HCL 2 MG/2ML IJ SOLN
INTRAMUSCULAR | Status: DC | PRN
  Administered 2022-03-21: 1 mg via INTRAVENOUS

## 2022-03-21 MED ORDER — BUPIVACAINE LIPOSOME 1.3 % IJ SUSP: 20.0000 mL | Freq: Once | INTRAMUSCULAR | Status: DC

## 2022-03-21 MED ORDER — OXYCODONE HCL 5 MG PO TABS
5.0000 mg | ORAL_TABLET | Freq: Four times a day (QID) | ORAL | 0 refills | Status: DC | PRN
  Filled 2022-03-21: qty 30, 4d supply, fill #0

## 2022-03-21 MED ORDER — CELECOXIB 200 MG PO CAPS
200.0000 mg | ORAL_CAPSULE | Freq: Once | ORAL | Status: AC
  Administered 2022-03-21: 200 mg via ORAL

## 2022-03-21 MED ORDER — BUPIVACAINE HCL 0.5 % IJ SOLN
INTRAMUSCULAR | Status: DC | PRN
  Administered 2022-03-21: 30 mL

## 2022-03-21 MED ORDER — LACTATED RINGERS IV SOLN: INTRAVENOUS | Status: DC

## 2022-03-21 MED ORDER — FENTANYL CITRATE (PF) 100 MCG/2ML IJ SOLN
INTRAMUSCULAR | Status: AC
  Filled 2022-03-21: qty 2

## 2022-03-21 MED ORDER — PROPOFOL 10 MG/ML IV BOLUS
INTRAVENOUS | Status: DC | PRN
  Administered 2022-03-21: 30 mg via INTRAVENOUS

## 2022-03-21 MED ORDER — SODIUM CHLORIDE 0.9% FLUSH: 3.0000 mL | Freq: Two times a day (BID) | INTRAVENOUS | Status: DC

## 2022-03-21 MED ORDER — PROPOFOL 1000 MG/100ML IV EMUL
INTRAVENOUS | Status: AC
  Filled 2022-03-21: qty 100

## 2022-03-21 MED ORDER — CELECOXIB 200 MG PO CAPS
ORAL_CAPSULE | ORAL | Status: AC
  Filled 2022-03-21: qty 1

## 2022-03-21 SURGICAL SUPPLY — 47 items
ADH SKN CLS APL DERMABOND .7 (GAUZE/BANDAGES/DRESSINGS)
APL PRP STRL LF DISP 70% ISPRP (MISCELLANEOUS)
APL SKNCLS STERI-STRIP NONHPOA (GAUZE/BANDAGES/DRESSINGS)
BENZOIN TINCTURE PRP APPL 2/3 (GAUZE/BANDAGES/DRESSINGS) IMPLANT
BLADE CLIPPER SENSICLIP SURGIC (BLADE) IMPLANT
BLADE SURG 15 STRL LF DISP TIS (BLADE) IMPLANT
BLADE SURG 15 STRL SS (BLADE) ×1
BRIEF MESH DISP LRG (UNDERPADS AND DIAPERS) ×1 IMPLANT
CHLORAPREP W/TINT 26 (MISCELLANEOUS) IMPLANT
COVER BACK TABLE 60X90IN (DRAPES) ×1 IMPLANT
COVER MAYO STAND STRL (DRAPES) ×1 IMPLANT
DERMABOND ADVANCED .7 DNX12 (GAUZE/BANDAGES/DRESSINGS) ×1 IMPLANT
DRAPE HYSTEROSCOPY (MISCELLANEOUS) IMPLANT
DRAPE LAPAROTOMY 100X72 PEDS (DRAPES) ×1 IMPLANT
DRAPE SHEET LG 3/4 BI-LAMINATE (DRAPES) IMPLANT
DRAPE UTILITY XL STRL (DRAPES) ×1 IMPLANT
DRSG TEGADERM 4X4.75 (GAUZE/BANDAGES/DRESSINGS) IMPLANT
ELECT REM PT RETURN 9FT ADLT (ELECTROSURGICAL) ×1
ELECTRODE REM PT RTRN 9FT ADLT (ELECTROSURGICAL) ×1 IMPLANT
GAUZE PAD ABD 8X10 STRL (GAUZE/BANDAGES/DRESSINGS) IMPLANT
GAUZE SPONGE 4X4 12PLY STRL (GAUZE/BANDAGES/DRESSINGS) IMPLANT
GAUZE SPONGE 4X4 12PLY STRL LF (GAUZE/BANDAGES/DRESSINGS) IMPLANT
GLOVE BIO SURGEON STRL SZ 6.5 (GLOVE) ×1 IMPLANT
GLOVE BIOGEL PI IND STRL 7.0 (GLOVE) ×1 IMPLANT
GLOVE INDICATOR 6.5 STRL GRN (GLOVE) ×1 IMPLANT
KIT SIGMOIDOSCOPE (SET/KITS/TRAYS/PACK) IMPLANT
KIT TURNOVER CYSTO (KITS) ×1 IMPLANT
LEGGING LITHOTOMY PAIR STRL (DRAPES) IMPLANT
NDL HYPO 22X1.5 SAFETY MO (MISCELLANEOUS) ×1 IMPLANT
NEEDLE HYPO 22X1.5 SAFETY MO (MISCELLANEOUS) ×1 IMPLANT
NEEDLE SAFETY HYPO 22GAX1.5 (MISCELLANEOUS) ×1
NS IRRIG 500ML POUR BTL (IV SOLUTION) IMPLANT
PAD ARMBOARD 7.5X6 YLW CONV (MISCELLANEOUS) IMPLANT
PENCIL SMOKE EVACUATOR (MISCELLANEOUS) ×1 IMPLANT
SLEEVE SCD COMPRESS KNEE MED (STOCKING) ×1 IMPLANT
SPIKE FLUID TRANSFER (MISCELLANEOUS) IMPLANT
STRIP CLOSURE SKIN 1/2X4 (GAUZE/BANDAGES/DRESSINGS) IMPLANT
SUT CHROMIC 3 0 SH 27 (SUTURE) IMPLANT
SUT VIC AB 2-0 SH 27 (SUTURE)
SUT VIC AB 2-0 SH 27XBRD (SUTURE) IMPLANT
SYR CONTROL 10ML LL (SYRINGE) ×1 IMPLANT
TOWEL OR 17X24 6PK STRL BLUE (TOWEL DISPOSABLE) ×1 IMPLANT
TRAY DSU PREP LF (CUSTOM PROCEDURE TRAY) ×1 IMPLANT
TUBE CONNECTING 12X1/4 (SUCTIONS) IMPLANT
UNDERPAD 30X36 HEAVY ABSORB (UNDERPADS AND DIAPERS) IMPLANT
WATER STERILE IRR 1000ML POUR (IV SOLUTION) ×1 IMPLANT
YANKAUER SUCT BULB TIP NO VENT (SUCTIONS) IMPLANT

## 2022-03-21 NOTE — Op Note (Signed)
03/21/2022  11:34 AM  PATIENT:  Aimee Medina  47 y.o. female  Patient Care Team: Burnard Hawthorne, FNP as PCP - General (Family Medicine)  PRE-OPERATIVE DIAGNOSIS:  ANAL PAPILLA, ANAL SKIN TAG  POST-OPERATIVE DIAGNOSIS:  ANAL PAPILLA, ANAL SKIN TAG  PROCEDURE:   EXCISION OF SKIN TAG AND ANAL PAPILLA   Surgeon(s): Leighton Ruff, MD  ASSISTANT: none   ANESTHESIA:   local/MAC  SPECIMEN:  Source of Specimen:  anal skin tag, anal papilla  DISPOSITION OF SPECIMEN:  PATHOLOGY  COUNTS:  YES  PLAN OF CARE: Discharge to home after PACU  PATIENT DISPOSITION:  PACU - hemodynamically stable.  INDICATION: 47 year old female with inflammatory polyp noted on external skin tag in the left posterior anal rectal region.  She also had an enlarged anal papilla internally at anterior midline.  I recommended excision   OR FINDINGS: Enlarged anal skin tag with inflammatory polyp left posterior, enlarged anal papilla anterior midline  DESCRIPTION: the patient was identified in the preoperative holding area and taken to the OR where they were laid on the operating room table.  MAC anesthesia was induced without difficulty. The patient was then positioned in prone jackknife position with buttocks gently taped apart.  The patient was then prepped and draped in usual sterile fashion.  SCDs were noted to be in place prior to the initiation of anesthesia. A surgical timeout was performed indicating the correct patient, procedure, positioning and need for preoperative antibiotics.  A rectal block was performed using Marcaine mixed with Exparel.    I began with a digital rectal exam.  There were no masses noted.  I gently dilated the anal canal to approximately 2 fingerbreadths.  I then placed a Hill-Ferguson anoscope into the anal canal and evaluated this completely.  There appeared to be recent trauma at the site of the left posterior skin tag.  There was an enlarged anal papilla at anterior midline.  There  were minimal internal hemorrhoids noted.  I began by excising the left posterior skin tag using Metzenbaum scissors.  I remove the hemorrhoidal tissue with this.  Sphincter complex was left intact.  The incision was then closed using a running 3-0 chromic suture.  Hemostasis was good.  I then turned my attention to the anterior papilla.  This was excised with Metzenbaum scissors and closed with a 3-0 chromic suture.  Another small anterior skin tag was excised and closed with a interrupted 3-0 chromic suture.  Additional Marcaine mixed with Exparel was placed around the incision sites for postoperative pain control.  A dressing was applied.  The patient was then awakened from anesthesia and sent to the postanesthesia care unit in stable condition.  All counts were correct per operating room staff.     Rosario Adie, MD  Colorectal and [facility]

## 2022-03-21 NOTE — Transfer of Care (Signed)
Immediate Anesthesia Transfer of Care Note  Patient: Aimee Medina  Procedure(s) Performed: Procedure(s) (LRB): EXCISION OF SKIN TAG AND ANAL PAPILLA (N/A)  Patient Location: PACU  Anesthesia Type: MAC  Level of Consciousness: awake, alert , oriented and patient cooperative  Airway & Oxygen Therapy: Patient Spontanous Breathing   Post-op Assessment: Report given to PACU RN and Post -op Vital signs reviewed and stable  Post vital signs: Reviewed and stable  Complications: No apparent anesthesia complications Last Vitals:  Vitals Value Taken Time  BP 126/75 03/21/22 1140  Temp    Pulse    Resp 11 03/21/22 1141  SpO2    Vitals shown include unvalidated device data.  Last Pain:  Vitals:   03/21/22 0918  TempSrc: Oral  PainSc: 0-No pain      Patients Stated Pain Goal: 5 ([id number])  Complications: No notable events documented.

## 2022-03-21 NOTE — Discharge Instructions (Addendum)
ANORECTAL SURGERY: POST OP INSTRUCTIONS Take your usually prescribed home medications unless otherwise directed. DIET: During the first few hours after surgery sip on some liquids until you are able to urinate.  It is normal to not urinate for several hours after this surgery.  If you feel uncomfortable, please contact the office for instructions.  After you are able to urinate,you may eat, if you feel like it.  Follow a light bland diet the first 24 hours after arrival home, such as soup, liquids, crackers, etc.  Be sure to include lots of fluids daily (6-8 glasses).  Avoid fast food or heavy meals, as your are more likely to get nauseated.  Eat a low fat diet the next few days after surgery.  Limit caffeine intake to 1-2 servings a day. PAIN CONTROL: Pain is best controlled by a usual combination of several different methods TOGETHER: Muscle relaxation: Soak in a warm bath (or Sitz bath) three times a day and after bowel movements.  Continue to do this until all pain is resolved. Over the counter pain medication Prescription pain medication Most patients will experience some swelling and discomfort in the anus/rectal area and incisions.  Heat such as warm towels, sitz baths, warm baths, etc to help relax tight/sore spots and speed recovery.  Some people prefer to use ice, especially in the first couple days after surgery, as it may decrease the pain and swelling, or alternate between ice & heat.  Experiment to what works for you.  Swelling and bruising can take several weeks to resolve.  Pain can take even longer to completely resolve. It is helpful to take an over-the-counter pain medication regularly for the first few weeks.  Choose one of the following that works best for you: Naproxen (Aleve, etc)  Two 220mg tabs twice a day Ibuprofen (Advil, etc) Three 200mg tabs four times a day (every meal & bedtime) A  prescription for pain medication (such as percocet, oxycodone, hydrocodone, etc) should be  given to you upon discharge.  Take your pain medication as prescribed.  If you are having problems/concerns with the prescription medicine (does not control pain, nausea, vomiting, rash, itching, etc), please call us [phone] to see if we need to switch you to a different pain medicine that will work better for you and/or control your side effect better. If you need a refill on your pain medication, please contact your pharmacy.  They will contact our office to request authorization. Prescriptions will not be filled after 5 pm or on week-ends. KEEP YOUR BOWELS REGULAR and AVOID CONSTIPATION The goal is one to two soft bowel movements a day.  You should at least have a bowel movement every other day. Avoid getting constipated.  Between the surgery and the pain medications, it is common to experience some constipation. This can be very painful after rectal surgery.  Increasing fluid intake and taking a fiber supplement (such as Metamucil, Citrucel, FiberCon, etc) 1-2 times a day regularly will usually help prevent this problem from occurring.  A stool softener like colace is also recommended.  This can be purchased over the counter at your pharmacy.  You can take it up to 3 times a day.  If you do not have a bowel movement after 24 hrs since your surgery, take one does of milk of magnesia.  If you still haven't had a bowel movement 8-12 hours after that dose, take another dose.  If you don't have a bowel movement 48 hrs after surgery,   purchase a Fleets enema from the drug store and administer gently per package instructions.  If you still are having trouble with your bowel movements after that, please call the office for further instructions. If you develop diarrhea or have many loose bowel movements, simplify your diet to bland foods & liquids for a few days.  Stop any stool softeners and decrease your fiber supplement.  Switching to mild anti-diarrheal medications (Kayopectate, Pepto Bismol) can help.   If this worsens or does not improve, please call us.  Wound Care Remove your bandages before your first bowel movement or 8 hours after surgery.     Remove any wound packing material at this tim,e as well.  You do not need to repack the wound unless instructed otherwise.  Wear an absorbent pad or soft cotton gauze in your underwear to catch any drainage and help keep the area clean. You should change this every 2-3 hours while awake. Keep the area clean and dry.  Bathe / shower every day, especially after bowel movements.  Keep the area clean by showering / bathing over the incision / wound.   It is okay to soak an open wound to help wash it.  Wet wipes or showers / gentle washing after bowel movements is often less traumatic than regular toilet paper. You may have some styrofoam-like soft packing in the rectum which will come out with the first bowel movement.  You will often notice bleeding with bowel movements.  This should slow down by the end of the first week of surgery Expect some drainage.  This should slow down, too, by the end of the first week of surgery.  Wear an absorbent pad or soft cotton gauze in your underwear until the drainage stops. Do Not sit on a rubber or pillow ring.  This can make you symptoms worse.  You may sit on a soft pillow if needed.  ACTIVITIES as tolerated:   You may resume regular (light) daily activities beginning the next day--such as daily self-care, walking, climbing stairs--gradually increasing activities as tolerated.  If you can walk 30 minutes without difficulty, it is safe to try more intense activity such as jogging, treadmill, bicycling, low-impact aerobics, swimming, etc. Save the most intensive and strenuous activity for last such as sit-ups, heavy lifting, contact sports, etc  Refrain from any heavy lifting or straining until you are off narcotics for pain control.   You may drive when you are no longer taking prescription pain medication, you can  comfortably sit for long periods of time, and you can safely maneuver your car and apply brakes. You may have sexual intercourse when it is comfortable.  FOLLOW UP in our office Please call [facility] at [phone] to set up an appointment to see your surgeon in the office for a follow-up appointment approximately 3-4 weeks after your surgery. Make sure that you call for this appointment the day you arrive home to insure a convenient appointment time. 10. IF YOU HAVE DISABILITY OR FAMILY LEAVE FORMS, BRING THEM TO THE OFFICE FOR PROCESSING.  DO NOT GIVE THEM TO YOUR DOCTOR.     WHEN TO CALL us [phone]: Poor pain control Reactions / problems with new medications (rash/itching, nausea, etc)  Fever over 101.5 F (38.5 C) Inability to urinate Nausea and/or vomiting Worsening swelling or bruising Continued bleeding from incision. Increased pain, redness, or drainage from the incision  The clinic staff is available to answer your questions during regular business hours (8:30am-5pm).  Please don't hesitate to call and ask to speak to one of our nurses for clinical concerns.   A surgeon from [facility] is always on call at the hospitals   If you have a medical emergency, go to the nearest emergency room or call 911.    [facility], [address], [address], [place], [place]  [address] ? MAIN: [phone] ? TOLL FREE: [phone] ? FAX [phone] [email or web address]       No acetaminophen/Tylenol until after 3:20 pm today if needed.  No ibuprofen, Advil, Aleve, Motrin, ketorolac, meloxicam, naproxen, or other NSAIDS until after 3:20 pm today if needed.    Information for Discharge Teaching: EXPAREL (bupivacaine liposome injectable suspension)   Your surgeon or anesthesiologist gave you EXPAREL(bupivacaine) to help control your pain after surgery.  EXPAREL is a local anesthetic that provides pain relief by numbing the  tissue around the surgical site. EXPAREL is designed to release pain medication over time and can control pain for up to 72 hours. Depending on how you respond to EXPAREL, you may require less pain medication during your recovery.  Possible side effects: Temporary loss of sensation or ability to move in the area where bupivacaine was injected. Nausea, vomiting, constipation Rarely, numbness and tingling in your mouth or lips, lightheadedness, or anxiety may occur. Call your doctor right away if you think you may be experiencing any of these sensations, or if you have other questions regarding possible side effects.  Follow all other discharge instructions given to you by your surgeon or nurse. Eat a healthy diet and drink plenty of water or other fluids.  If you return to the hospital for any reason within 96 hours following the administration of EXPAREL, it is important for health care providers to know that you have received this anesthetic. A teal colored band has been placed on your arm with the date, time and amount of EXPAREL you have received in order to alert and inform your health care providers. Please leave this armband in place for the full 96 hours following administration, and then you may remove the band. Post Anesthesia Home Care Instructions  Activity: Get plenty of rest for the remainder of the day. A responsible individual must stay with you for 24 hours following the procedure.  For the next 24 hours, DO NOT: -Drive a car -Paediatric nurse -Drink alcoholic beverages -Take any medication unless instructed by your physician -Make any legal decisions or sign important papers.  Meals: Start with liquid foods such as gelatin or soup. Progress to regular foods as tolerated. Avoid greasy, spicy, heavy foods. If nausea and/or vomiting occur, drink only clear liquids until the nausea and/or vomiting subsides. Call your physician if vomiting continues.  Special  Instructions/Symptoms: Your throat may feel dry or sore from the anesthesia or the breathing tube placed in your throat during surgery. If this causes discomfort, gargle with warm salt water. The discomfort should disappear within 24 hours.

## 2022-03-21 NOTE — Interval H&P Note (Signed)
History and Physical Interval Note:  03/21/2022 10:44 AM  Aimee Medina  has presented today for surgery, with the diagnosis of ANAL PAPILLA, ANAL SKIN TAG.  The various methods of treatment have been discussed with the patient and family. After consideration of risks, benefits and other options for treatment, the patient has consented to  Procedure(s): EXCISION OF SKIN TAG AND ANAL PAPILLA (N/A) as a surgical intervention.  The patient's history has been reviewed, patient examined, no change in status, stable for surgery.  I have reviewed the patient's chart and labs.  Questions were answered to the patient's satisfaction.     Rosario Adie, MD  [facility]

## 2022-03-21 NOTE — Anesthesia Postprocedure Evaluation (Signed)
Anesthesia Post Note  Patient: Basya R Back  Procedure(s) Performed: EXCISION OF SKIN TAG AND ANAL PAPILLA     Patient location during evaluation: PACU Anesthesia Type: MAC Level of consciousness: awake and alert Pain management: pain level controlled Vital Signs Assessment: post-procedure vital signs reviewed and stable Respiratory status: spontaneous breathing, nonlabored ventilation, respiratory function stable and patient connected to nasal cannula oxygen Cardiovascular status: stable and blood pressure returned to baseline Postop Assessment: no apparent nausea or vomiting Anesthetic complications: no   No notable events documented.  Last Vitals:  Vitals:   03/21/22 1215 03/21/22 1258  BP: 129/84 127/78  Pulse: 62 68  Resp: 11 12  Temp:  37.2 C  SpO2: 100% 100%    Last Pain:  Vitals:   03/21/22 1258  TempSrc:   PainSc: 0-No pain                 Belenda Cruise P Isobella Ascher

## 2022-03-21 NOTE — Anesthesia Procedure Notes (Signed)
Procedure Name: MAC Date/Time: 03/21/2022 10:56 AM  Performed by: Suan Halter, CRNAPre-anesthesia Checklist: Patient identified, Emergency Drugs available, Suction available, Patient being monitored and Timeout performed Oxygen Delivery Method: Simple face mask

## 2022-03-21 NOTE — Anesthesia Preprocedure Evaluation (Signed)
Anesthesia Evaluation  Patient identified by MRN, date of birth, ID band Patient awake    Reviewed: Allergy & Precautions, NPO status , Patient's Chart, lab work & pertinent test results  Airway Mallampati: II  TM Distance: >3 FB Neck ROM: Full    Dental no notable dental hx.    Pulmonary neg pulmonary ROS   Pulmonary exam normal        Cardiovascular negative cardio ROS  Rhythm:Regular Rate:Normal     Neuro/Psych negative neurological ROS  negative psych ROS   GI/Hepatic negative GI ROS, Neg liver ROS,,,Anal papilla   Endo/Other  negative endocrine ROS    Renal/GU negative Renal ROS  negative genitourinary   Musculoskeletal negative musculoskeletal ROS (+)    Abdominal Normal abdominal exam  (+)   Peds  Hematology negative hematology ROS (+)   Anesthesia Other Findings   Reproductive/Obstetrics                             Anesthesia Physical Anesthesia Plan  ASA: 2  Anesthesia Plan: MAC   Post-op Pain Management:    Induction: Intravenous  PONV Risk Score and Plan: 2 and Ondansetron, Dexamethasone, Propofol infusion, Midazolam and Treatment may vary due to age or medical condition  Airway Management Planned: Simple Face Mask, Natural Airway and Nasal Cannula  Additional Equipment: None  Intra-op Plan:   Post-operative Plan:   Informed Consent: I have reviewed the patients History and Physical, chart, labs and discussed the procedure including the risks, benefits and alternatives for the proposed anesthesia with the patient or authorized representative who has indicated his/her understanding and acceptance.     Dental advisory given  Plan Discussed with:   Anesthesia Plan Comments:        Anesthesia Quick Evaluation

## 2022-03-22 ENCOUNTER — Encounter (HOSPITAL_BASED_OUTPATIENT_CLINIC_OR_DEPARTMENT_OTHER): Payer: Self-pay | Admitting: General Surgery

## 2022-03-22 LAB — SURGICAL PATHOLOGY

## 2022-04-16 DIAGNOSIS — K644 Residual hemorrhoidal skin tags: Secondary | ICD-10-CM | POA: Diagnosis not present

## 2022-04-20 ENCOUNTER — Other Ambulatory Visit: Payer: Self-pay

## 2022-04-20 ENCOUNTER — Other Ambulatory Visit: Payer: Self-pay | Admitting: Family

## 2022-04-20 DIAGNOSIS — E669 Obesity, unspecified: Secondary | ICD-10-CM

## 2022-04-20 MED ORDER — WEGOVY 1 MG/0.5ML ~~LOC~~ SOAJ
1.0000 mg | SUBCUTANEOUS | 3 refills | Status: DC
  Filled 2022-04-20: qty 2, 28d supply, fill #0

## 2022-05-01 ENCOUNTER — Encounter: Payer: Self-pay | Admitting: Obstetrics & Gynecology

## 2022-05-01 ENCOUNTER — Ambulatory Visit (INDEPENDENT_AMBULATORY_CARE_PROVIDER_SITE_OTHER): Payer: 59 | Admitting: Obstetrics & Gynecology

## 2022-05-01 ENCOUNTER — Other Ambulatory Visit (HOSPITAL_COMMUNITY)
Admission: RE | Admit: 2022-05-01 | Discharge: 2022-05-01 | Disposition: A | Discharge status: Home / Self Care | Payer: 59 | Source: Ambulatory Visit | Attending: Obstetrics & Gynecology | Admitting: Obstetrics & Gynecology

## 2022-05-01 VITALS — BP 124/78 | HR 76 | Wt 155.0 lb

## 2022-05-01 DIAGNOSIS — Z113 Encounter for screening for infections with a predominantly sexual mode of transmission: Secondary | ICD-10-CM | POA: Diagnosis not present

## 2022-05-01 DIAGNOSIS — Z23 Encounter for immunization: Secondary | ICD-10-CM | POA: Diagnosis not present

## 2022-05-01 DIAGNOSIS — Z01419 Encounter for gynecological examination (general) (routine) without abnormal findings: Secondary | ICD-10-CM | POA: Insufficient documentation

## 2022-05-01 DIAGNOSIS — D352 Benign neoplasm of pituitary gland: Secondary | ICD-10-CM | POA: Diagnosis not present

## 2022-05-01 NOTE — Progress Notes (Signed)
GYNECOLOGY ANNUAL PREVENTATIVE CARE ENCOUNTER NOTE  History:     Aimee Medina is a 47 y.o. [phone] female here for a routine annual gynecologic exam.  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  Also desires yearly STI screen.   Denies abnormal vaginal bleeding, discharge, pelvic pain, problems with intercourse or other gynecologic concerns.    Gynecologic History Patient's last menstrual period was 04/23/2022. Contraception: condoms Last Pap: 03/14/2021. Result was ASCUS with negative HPV Last Mammogram: 02/19/2022.  Result was normal Last Colonoscopy: 02/23/2022.  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 Tedd Sias;  and pituitary adenoma ;   treated w/ cabergoline   Pituitary adenoma 07/2018   Psoriasis    Seasonal and perennial allergic rhinitis 01/21/2015   Vitamin D deficiency 03/27/2017    Past Surgical History:  Procedure Laterality Date   ABDOMINOPLASTY  2015   APPENDECTOMY  09/2009   EXCISION OF SKIN TAG N/A 03/21/2022   Procedure: EXCISION OF SKIN TAG AND ANAL PAPILLA;  Surgeon: Romie Levee, MD;  Location: [facility];  Service: General;  Laterality: N/A;    Current Outpatient Medications on File Prior to Visit  Medication Sig Dispense Refill   cabergoline (DOSTINEX) 0.5 MG tablet Take 1 tablet (0.5 mg total) by mouth 2 (two) times a week. 24 tablet 3   Semaglutide-Weight Management (WEGOVY) 1 MG/0.5ML SOAJ Inject 1 mg  into the skin once a week. 2 mL 3   No current facility-administered medications on file prior to visit.    No Known Allergies  Social History:  reports that she has never smoked. She has never used smokeless tobacco. She reports that she does not drink alcohol and does not use drugs.  Family History  Problem Relation Age of Onset   High blood pressure Mother    Heart disease Mother    Stroke Mother        brain aneursym   Hypertension Father    Stroke Father    Prostate cancer Father 68       prostate- currently in remission   High Cholesterol Father    Pancreatic cancer Maternal Grandmother 61   Colon cancer Maternal Grandfather 69   Prostate cancer Paternal Grandfather 41   Allergies Daughter    Allergies Son    Breast cancer Neg Hx    Thyroid cancer Neg Hx    Colon polyps Neg Hx    Esophageal cancer Neg Hx    Stomach cancer Neg Hx    Rectal cancer Neg Hx     The following portions of the patient's history were reviewed and updated as appropriate: allergies, current medications, past family history, past medical history,  past social history, past surgical history and problem list.  Review of Systems Pertinent items noted in HPI and remainder of comprehensive ROS otherwise negative.  Physical Exam:  BP 124/78   Pulse 76   Wt 155 lb (70.3 kg)   LMP 04/23/2022   BMI 24.28 kg/m  CONSTITUTIONAL: Well-developed, well-nourished female in no acute distress.  HENT:  Normocephalic, atraumatic, External right and left ear normal.  EYES: Conjunctivae and EOM are normal. Pupils are equal, round, and reactive to light. No scleral icterus.  NECK: Normal range of motion, supple, no masses.  Normal thyroid.  SKIN: Skin is warm and dry. No rash noted. Not diaphoretic. No erythema. No pallor. MUSCULOSKELETAL: Normal range of motion. No tenderness.  No cyanosis, clubbing, or edema. NEUROLOGIC: Alert and oriented to person, place, and time. Normal reflexes, muscle tone coordination.   PSYCHIATRIC: Normal mood and affect. Normal behavior. Normal judgment and thought content. CARDIOVASCULAR: Normal heart rate noted, regular rhythm RESPIRATORY: Clear to auscultation bilaterally. Effort and breath sounds normal, no problems with respiration noted. BREASTS: Symmetric in size. No masses, tenderness, skin changes, nipple drainage, or lymphadenopathy bilaterally. Performed in the presence of a chaperone. ABDOMEN: Soft, no distention noted.  No tenderness, rebound or guarding.  PELVIC: Normal appearing external genitalia and urethral meatus; normal appearing vaginal mucosa and cervix.  No abnormal vaginal discharge noted.  Pap smear obtained.  Normal uterine size, no other palpable masses, no uterine or adnexal tenderness.  Performed in the presence of a chaperone.   Assessment and Plan:    1. Microprolactinoma Has not taken her Dostinex in a while. Will check level and follow up with Endocrinologist. - Prolactin  2. Need for HPV vaccine Emphasized importance of completing series. Next appointments will be in 2 and 6 months.  - HPV 9-valent vaccine,Recombinant  3. Routine screening for STI (sexually transmitted infection) STI screen done, will follow up results and manage accordingly. - RPR+HBsAg+HCVAb+HIV - Cytology - PAP  4. Well woman exam with routine gynecological exam - Cytology - PAP Will follow up results of pap smear and manage accordingly. Mammogram and colon cancer screening are up to date. Routine preventative health maintenance measures emphasized. Please refer to After Visit Summary for other counseling recommendations.      Jaynie Collins, MD, FACOG Obstetrician & Gynecologist, [facility] for [employer], [facility]

## 2022-05-02 LAB — PROLACTIN: Prolactin: 22.2 ng/mL (ref 4.8–33.4)

## 2022-05-02 LAB — RPR+HBSAG+HCVAB+...
HIV Screen 4th Generation wRfx: NONREACTIVE
Hep C Virus Ab: NONREACTIVE
Hepatitis B Surface Ag: NEGATIVE
RPR Ser Ql: NONREACTIVE

## 2022-05-07 LAB — CYTOLOGY - PAP
Chlamydia: NEGATIVE
Comment: NEGATIVE
Comment: NEGATIVE
Comment: NEGATIVE
Comment: NORMAL
Diagnosis: UNDETERMINED — AB
High risk HPV: NEGATIVE
Neisseria Gonorrhea: NEGATIVE
Trichomonas: NEGATIVE

## 2022-07-02 ENCOUNTER — Ambulatory Visit (INDEPENDENT_AMBULATORY_CARE_PROVIDER_SITE_OTHER): Payer: 59 | Admitting: *Deleted

## 2022-07-02 DIAGNOSIS — Z23 Encounter for immunization: Secondary | ICD-10-CM

## 2022-07-02 NOTE — Progress Notes (Signed)
Aimee Medina is here for their 2nd Gardasil injection. Pt denies any issues at this time. Pt tolerated injection well. To follow up in 4 months for next injection.    Scheryl Marten, RN

## 2022-07-09 ENCOUNTER — Other Ambulatory Visit (HOSPITAL_BASED_OUTPATIENT_CLINIC_OR_DEPARTMENT_OTHER): Payer: Self-pay

## 2022-07-09 ENCOUNTER — Ambulatory Visit
Admission: RE | Admit: 2022-07-09 | Discharge: 2022-07-09 | Disposition: A | Discharge status: Home / Self Care | Payer: 59 | Source: Ambulatory Visit | Attending: Nurse Practitioner | Admitting: Nurse Practitioner

## 2022-07-09 VITALS — BP 128/80 | HR 76 | Temp 98.5°F | Resp 18

## 2022-07-09 DIAGNOSIS — L02212 Cutaneous abscess of back [any part, except buttock]: Secondary | ICD-10-CM | POA: Diagnosis not present

## 2022-07-09 MED ORDER — SULFAMETHOXAZOLE-TRIMETHOPRIM 800-160 MG PO TABS
1.0000 | ORAL_TABLET | Freq: Two times a day (BID) | ORAL | 0 refills | Status: AC
  Filled 2022-07-09: qty 14, 7d supply, fill #0

## 2022-07-09 NOTE — ED Provider Notes (Signed)
[facility]    CSN: [id number] Arrival date & time: 07/09/22  4098      History   Chief Complaint Chief Complaint  Patient presents with   Abscess    Abscess on left side of back. - Entered by patient    HPI Aimee Medina is a 47 y.o. female presents for evaluation of an abscess.  Patient states 5 days of a worsening abscess to her left lower back.  She states its began to drain a purulent discharge on its own.  No fevers or chills.  No history of MRSA.  She has been using Neosporin to the area.  No other concerns at this time.   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 Authorizing Provider  sulfamethoxazole-trimethoprim (BACTRIM DS) 800-160 MG  tablet Take 1 tablet by mouth 2 (two) times daily for 7 days. 07/09/22 07/16/22 Yes Radford Pax, NP  cabergoline (DOSTINEX) 0.5 MG tablet Take 1 tablet (0.5 mg total) by mouth 2 (two) times a week. 09/07/21     Semaglutide-Weight Management (WEGOVY) 1 MG/0.5ML SOAJ Inject 1 mg into the skin once a week. 04/20/22   Allegra Grana, FNP    Family History Family History  Problem Relation Age of Onset   High blood pressure Mother    Heart disease Mother    Stroke Mother        brain aneursym   Hypertension Father    Stroke Father    Prostate cancer Father 20       prostate- currently in remission   High Cholesterol Father    Pancreatic cancer Maternal Grandmother 37   Colon cancer Maternal Grandfather 69   Prostate cancer Paternal Grandfather 24   Allergies Daughter    Allergies Son    Breast cancer Neg Hx    Thyroid cancer Neg Hx    Colon polyps Neg Hx    Esophageal  cancer Neg Hx    Stomach cancer Neg Hx    Rectal cancer Neg Hx     Social History Social History   Tobacco Use   Smoking status: Never   Smokeless tobacco: Never  Vaping Use   Vaping Use: Never used  Substance Use Topics   Alcohol use: No    Alcohol/week: 0.0 standard drinks of alcohol    Comment: very rare   Drug use: No     Allergies   Patient has no known allergies.   Review of Systems Review of Systems  Skin:        abscess     Physical Exam Triage Vital Signs ED Triage Vitals  Enc Vitals Group     BP 07/09/22 0834 128/80     Pulse Rate 07/09/22 0834 76     Resp 07/09/22 0834 18     Temp 07/09/22 0834 98.5 F (36.9 C)     Temp Source 07/09/22 0834 Oral     SpO2 07/09/22 0834 98 %     Weight --      Height --      Head Circumference --      Peak Flow --      Pain Score 07/09/22 0831 3     Pain Loc --      Pain Edu? --      Excl. in GC? --    No data found.  Updated Vital Signs BP 128/80 (BP Location: Right Arm)   Pulse 76   Temp 98.5 F (36.9 C) (Oral)   Resp 18   LMP  06/29/2022 (Approximate)   SpO2 98%   Visual Acuity Right Eye Distance:   Left Eye Distance:   Bilateral Distance:    Right Eye Near:   Left Eye Near:    Bilateral Near:     Physical Exam Vitals and nursing note reviewed.  Constitutional:      General: She is not in acute distress.    Appearance: Normal appearance. She is not ill-appearing.  HENT:     Head: Normocephalic and atraumatic.  Eyes:     Pupils: Pupils are equal, round, and reactive to light.  Cardiovascular:     Rate and Rhythm: Normal rate.  Pulmonary:     Effort: Pulmonary effort is normal.  Skin:    General: Skin is warm and dry.          Comments: 1cm indurated abscess with scant purulent drainage noted. No warmth, mild swelling.   Neurological:     General: No focal deficit present.     Mental Status: She is alert and oriented to person, place, and time.  Psychiatric:        Mood and Affect: Mood normal.        Behavior: Behavior normal.      UC Treatments / Results  Labs (all labs ordered are listed, but only abnormal results are displayed) Labs Reviewed - No data to display  EKG   Radiology No results found.  Procedures Procedures (including critical care time)  Medications Ordered in UC Medications - No data to display  Initial Impression / Assessment and Plan / UC Course  I have reviewed the triage vital signs and the nursing notes.  Pertinent labs & imaging results that were available during my care of the patient were reviewed by me and considered in my medical decision making (see chart for details).     Start Bactrim. Warm compresses will encourage it to  continue draining  PCP follow up if symptoms do not improve ER precautions reviewed and pt verbalized understanding  Final Clinical Impressions(s) / UC Diagnoses   Final diagnoses:  Abscess of back     Discharge Instructions      Bactrim twice daily for 7 days.  Warm compresses to the area will encourage it to  drain. Please follow-up with your PCP if your symptoms do not improve Please go to the ER for any worsening symptoms Hope you feel better soon!    ED Prescriptions     Medication Sig Dispense Auth. Provider   sulfamethoxazole-trimethoprim (BACTRIM DS) 800-160 MG tablet Take 1 tablet by mouth 2 (two) times daily for 7 days. 14 tablet Radford Pax, NP      PDMP not reviewed this encounter.   Radford Pax, NP 07/09/22 [phone]

## 2022-07-09 NOTE — Discharge Instructions (Signed)
Bactrim twice daily for 7 days.  Warm compresses to the area will encourage it to drain. Please follow-up with your PCP if your symptoms do not improve Please go to the ER for any worsening symptoms Hope you feel better soon!

## 2022-07-09 NOTE — ED Triage Notes (Addendum)
Pt has concern for abscess to lower left side of back. Noticed small bump on Thursday. Bump grew larger and more painful on Friday, then "burst" on Saturday. Pt noted foul odor. Site is still tender, swollen, and has some drainage. Has been using Neosporin with lidocaine.

## 2022-11-07 ENCOUNTER — Ambulatory Visit: Payer: 59

## 2023-03-29 ENCOUNTER — Other Ambulatory Visit: Payer: Self-pay | Admitting: Family

## 2023-03-29 DIAGNOSIS — Z0000 Encounter for general adult medical examination without abnormal findings: Secondary | ICD-10-CM

## 2023-04-02 ENCOUNTER — Ambulatory Visit
Admission: RE | Admit: 2023-04-02 | Discharge: 2023-04-02 | Disposition: A | Discharge status: Home / Self Care | Source: Ambulatory Visit | Attending: Family

## 2023-04-02 DIAGNOSIS — Z0000 Encounter for general adult medical examination without abnormal findings: Secondary | ICD-10-CM

## 2023-04-04 ENCOUNTER — Encounter: Payer: Self-pay | Admitting: Family

## 2023-04-04 ENCOUNTER — Other Ambulatory Visit: Payer: Self-pay | Admitting: Family

## 2023-04-04 DIAGNOSIS — R928 Other abnormal and inconclusive findings on diagnostic imaging of breast: Secondary | ICD-10-CM

## 2023-04-18 ENCOUNTER — Ambulatory Visit

## 2023-04-18 ENCOUNTER — Ambulatory Visit
Admission: RE | Admit: 2023-04-18 | Discharge: 2023-04-18 | Disposition: A | Discharge status: Home / Self Care | Source: Ambulatory Visit | Attending: Family

## 2023-04-18 DIAGNOSIS — R928 Other abnormal and inconclusive findings on diagnostic imaging of breast: Secondary | ICD-10-CM

## 2023-07-29 ENCOUNTER — Encounter: Payer: Self-pay | Admitting: Family

## 2023-07-29 ENCOUNTER — Other Ambulatory Visit (HOSPITAL_COMMUNITY)
Admission: RE | Admit: 2023-07-29 | Discharge: 2023-07-29 | Disposition: A | Discharge status: Home / Self Care | Source: Ambulatory Visit | Attending: Family | Admitting: Family

## 2023-07-29 ENCOUNTER — Ambulatory Visit: Admitting: Family

## 2023-07-29 VITALS — BP 126/74 | HR 74 | Temp 98.6°F | Ht 67.0 in | Wt 173.0 lb

## 2023-07-29 DIAGNOSIS — Z0000 Encounter for general adult medical examination without abnormal findings: Secondary | ICD-10-CM

## 2023-07-29 DIAGNOSIS — D352 Benign neoplasm of pituitary gland: Secondary | ICD-10-CM

## 2023-07-29 DIAGNOSIS — E66812 Obesity, class 2: Secondary | ICD-10-CM

## 2023-07-29 LAB — CBC WITH DIFFERENTIAL/PLATELET
Basophils Absolute: 0 K/uL (ref 0.0–0.1)
Basophils Relative: 0.8 % (ref 0.0–3.0)
Eosinophils Absolute: 0.1 K/uL (ref 0.0–0.7)
Eosinophils Relative: 1 % (ref 0.0–5.0)
HCT: 40.2 % (ref 36.0–46.0)
Hemoglobin: 13.2 g/dL (ref 12.0–15.0)
Lymphocytes Relative: 30.3 % (ref 12.0–46.0)
Lymphs Abs: 1.7 K/uL (ref 0.7–4.0)
MCHC: 32.8 g/dL (ref 30.0–36.0)
MCV: 91.1 fl (ref 78.0–100.0)
Monocytes Absolute: 0.3 K/uL (ref 0.1–1.0)
Monocytes Relative: 6 % (ref 3.0–12.0)
Neutro Abs: 3.5 K/uL (ref 1.4–7.7)
Neutrophils Relative %: 61.9 % (ref 43.0–77.0)
Platelets: 242 K/uL (ref 150.0–400.0)
RBC: 4.42 Mil/uL (ref 3.87–5.11)
RDW: 13.4 % (ref 11.5–15.5)
WBC: 5.7 K/uL (ref 4.0–10.5)

## 2023-07-29 LAB — COMPREHENSIVE METABOLIC PANEL WITH GFR
ALT: 18 U/L (ref 0–35)
AST: 20 U/L (ref 0–37)
Albumin: 4.1 g/dL (ref 3.5–5.2)
Alkaline Phosphatase: 48 U/L (ref 39–117)
BUN: 15 mg/dL (ref 6–23)
CO2: 24 meq/L (ref 19–32)
Calcium: 9 mg/dL (ref 8.4–10.5)
Chloride: 106 meq/L (ref 96–112)
Creatinine, Ser: 0.88 mg/dL (ref 0.40–1.20)
GFR: 78.8 mL/min (ref >60.00)
Glucose, Bld: 78 mg/dL (ref 70–99)
Potassium: 4.1 meq/L (ref 3.5–5.1)
Sodium: 136 meq/L (ref 135–145)
Total Bilirubin: 0.6 mg/dL (ref 0.2–1.2)
Total Protein: 6.8 g/dL (ref 6.0–8.3)

## 2023-07-29 LAB — LIPID PANEL
Cholesterol: 161 mg/dL (ref 0–200)
HDL: 66.3 mg/dL (ref >39.00)
LDL Cholesterol: 84 mg/dL (ref 0–99)
NonHDL: 94.68
Total CHOL/HDL Ratio: 2
Triglycerides: 51 mg/dL (ref 0.0–149.0)
VLDL: 10.2 mg/dL (ref 0.0–40.0)

## 2023-07-29 LAB — HEMOGLOBIN A1C: Hgb A1c MFr Bld: 5.2 % (ref 4.6–6.5)

## 2023-07-29 LAB — VITAMIN D 25 HYDROXY (VIT D DEFICIENCY, FRACTURES): VITD: 16.2 ng/mL — ABNORMAL LOW (ref 30.00–100.00)

## 2023-07-29 LAB — TSH: TSH: 1.65 u[IU]/mL (ref 0.35–5.50)

## 2023-07-29 MED ORDER — METFORMIN HCL ER 500 MG PO TB24: 500.0000 mg | ORAL_TABLET | Freq: Every evening | ORAL | 2 refills | Status: AC

## 2023-07-29 NOTE — Patient Instructions (Addendum)
 Referral endocrine  Let us  know if you dont hear back within a week in regards to an appointment being scheduled.   So that you are aware, if you are [facility] MyChart user , please pay attention to your MyChart messages as you may receive a MyChart message with a phone number to call and schedule this test/appointment own your own from our referral coordinator. This is a new process so I do not want you to miss this message.  If you are not a MyChart user, you will receive a phone call.   Metformin  is used in prediabetes, diabetes, and also for weight loss by decreasing calorie consumption.   It works in a couple of ways by decreasing liver glucose production, decreases intestinal absorption of glucose and improves insulin  sensitivity (increases peripheral glucose uptake and utilization).     Please make sure that you titrate per below so not to cause any GI upset.    Start metformin  XR with one 500mg  tablet at night. After one week, you may increase to two tablets at night ( total of 1000mg ) . The third week, you may take take two tablets at night and one tablet in the morning.  The fourth week, you may take two tablets in the morning ( 1000mg  total) and two tablets at night (1000mg  total). This will bring you to a maximum daily dose of 2000mg /day which is maximum dose.  So you are aware,  you may take ALL 4 tablets of metformin  together at the same time if preferable and doesn't cause GI upset. You may take metformin  2000mg  ( four of the 500mg  tablets) together in the morning or at night if you prefer.   Along the way, if you want to increase more slowly, please do as this medication can cause GI discomfort and loose stools which usually get better with time , however some patients find that they can only tolerate a certain dose and cannot increase to maximum dose.   Health Maintenance, Female Adopting a healthy lifestyle and getting preventive care are important in promoting health and wellness. Ask  your health care provider about: The right schedule for you to have regular tests and exams. Things you can do on your own to prevent diseases and keep yourself healthy. What should I know about diet, weight, and exercise? Eat a healthy diet  Eat a diet that includes plenty of vegetables, fruits, low-fat dairy products, and lean protein. Do not eat a lot of foods that are high in solid fats, added sugars, or sodium. Maintain a healthy weight Body mass index (BMI) is used to identify weight problems. It estimates body fat based on height and weight. Your health care provider can help determine your BMI and help you achieve or maintain a healthy weight. Get regular exercise Get regular exercise. This is one of the most important things you can do for your health. Most adults should: Exercise for at least 150 minutes each week. The exercise should increase your heart rate and make you sweat (moderate-intensity exercise). Do strengthening exercises at least twice a week. This is in addition to the moderate-intensity exercise. Spend less time sitting. Even light physical activity can be beneficial. Watch cholesterol and blood lipids Have your blood tested for lipids and cholesterol at 46 years of age, then have this test every 5 years. Have your cholesterol levels checked more often if: Your lipid or cholesterol levels are high. You are older than 46 years of age. You are at high  risk for heart disease. What should I know about cancer screening? Depending on your health history and family history, you may need to have cancer screening at various ages. This may include screening for: Breast cancer. Cervical cancer. Colorectal cancer. Skin cancer. Lung cancer. What should I know about heart disease, diabetes, and high blood pressure? Blood pressure and heart disease High blood pressure causes heart disease and increases the risk of stroke. This is more likely to develop in people who have high  blood pressure readings or are overweight. Have your blood pressure checked: Every 3-5 years if you are 28-19 years of age. Every year if you are 34 years old or older. Diabetes Have regular diabetes screenings. This checks your fasting blood sugar level. Have the screening done: Once every three years after age 63 if you are at a normal weight and have a low risk for diabetes. More often and at a younger age if you are overweight or have a high risk for diabetes. What should I know about preventing infection? Hepatitis B If you have a higher risk for hepatitis B, you should be screened for this virus. Talk with your health care provider to find out if you are at risk for hepatitis B infection. Hepatitis C Testing is recommended for: Everyone born from [birth year] through 1963. Anyone with known risk factors for hepatitis C. Sexually transmitted infections (STIs) Get screened for STIs, including gonorrhea and chlamydia, if: You are sexually active and are younger than 46 years of age. You are older than 46 years of age and your health care provider tells you that you are at risk for this type of infection. Your sexual activity has changed since you were last screened, and you are at increased risk for chlamydia or gonorrhea. Ask your health care provider if you are at risk. Ask your health care provider about whether you are at high risk for HIV. Your health care provider may recommend a prescription medicine to help prevent HIV infection. If you choose to take medicine to prevent HIV, you should first get tested for HIV. You should then be tested every 3 months for as long as you are taking the medicine. Pregnancy If you are about to stop having your period (premenopausal) and you may become pregnant, seek counseling before you get pregnant. Take 400 to 800 micrograms (mcg) of folic acid every day if you become pregnant. Ask for birth control (contraception) if you want to prevent  pregnancy. Osteoporosis and menopause Osteoporosis is a disease in which the bones lose minerals and strength with aging. This can result in bone fractures. If you are 70 years old or older, or if you are at risk for osteoporosis and fractures, ask your health care provider if you should: Be screened for bone loss. Take a calcium or vitamin D  supplement to lower your risk of fractures. Be given hormone replacement therapy (HRT) to treat symptoms of menopause. Follow these instructions at home: Alcohol use Do not drink alcohol if: Your health care provider tells you not to drink. You are pregnant, may be pregnant, or are planning to become pregnant. If you drink alcohol: Limit how much you have to: 0-1 drink a day. Know how much alcohol is in your drink. In the [place] one drink equals one 12 oz bottle of beer (355 mL), one 5 oz glass of wine (148 mL), or one 1 oz glass of hard liquor (44 mL). Lifestyle Do not use any products that contain nicotine or tobacco.  These products include cigarettes, chewing tobacco, and vaping devices, such as e-cigarettes. If you need help quitting, ask your health care provider. Do not use street drugs. Do not share needles. Ask your health care provider for help if you need support or information about quitting drugs. General instructions Schedule regular health, dental, and eye exams. Stay current with your vaccines. Tell your health care provider if: You often feel depressed. You have ever been abused or do not feel safe at home. Summary Adopting a healthy lifestyle and getting preventive care are important in promoting health and wellness. Follow your health care provider's instructions about healthy diet, exercising, and getting tested or screened for diseases. Follow your health care provider's instructions on monitoring your cholesterol and blood pressure. This information is not intended to replace advice given to you by your health care provider.  Make sure you discuss any questions you have with your health care provider. Document Revised: 05/23/2020 Document Reviewed: 05/23/2020 Elsevier Patient Education  2024 [employer].

## 2023-07-29 NOTE — Progress Notes (Signed)
 Assessment & Plan:  Annual physical exam Assessment & Plan: Clinical breast exam performed today.  Pap smear obtained.  Encouraged continued exercise  Orders: -     VITAMIN D  25 Hydroxy (Vit-D Deficiency, Fractures) -     Hemoglobin A1c -     CBC with Differential/Platelet -     Comprehensive metabolic panel with GFR -     Lipid panel -     TSH -     Cytology - PAP  Obesity, Class II, BMI 35-39.9, isolated Assessment & Plan: Insurance will not cover for GLP-1 agonist at this time.  We agreed metformin  is a prudent choice.  Discussed mechanism of action, titration and side effects.  Orders: -     metFORMIN  HCl ER; Take 1 tablet (500 mg total) by mouth every evening.  Dispense: 90 tablet; Refill: 2  Microprolactinoma [facility]) Assessment & Plan: Overdue for surveillance.  New referral placed to endocrinology.  Orders: -     Ambulatory referral to Endocrinology     Return precautions given.   Risks, benefits, and alternatives of the medications and treatment plan prescribed today were discussed, and patient expressed understanding.   Education regarding symptom management and diagnosis given to patient on AVS either electronically or printed.  No follow-ups on file.  Rollene Northern, FNP  Subjective:    Patient ID: Aimee Medina, female    DOB: 1975/12/13, 48 y.o.   MRN: [id number]  CC: Aimee Medina is a 48 y.o. female who presents today for physical exam.    HPI: She remains frustrated by weight gain.  She did well on wegovy , insurance stopped paying      Colorectal Cancer Screening: UTD 02/23/2022, Dr Leigh; s/p anal skin tag excision 03/2022  Breast Cancer Screening: Mammogram UTD Cervical Cancer Screening: due; ;last 05/01/22 ASC-US , negative HPV  Bone Health screening/DEXA for 65+: No increased fracture risk. Defer screening at this time.  Lung Cancer Screening: Doesn't have 20 year pack year history and age > 24 years yo 48 years          Tetanus - UTD        Exercise: Gets regular exercise, walking 3 times per day.   Alcohol use:  none Smoking/tobacco use: Nonsmoker.    Health Maintenance  Topic Date Due   Hepatitis B Vaccine (2 of 3 - 19+ 3-dose series) 01/05/2013   COVID-19 Vaccine (4 - 2024-25 season) 09/16/2022   HPV Vaccine (3 - Risk 3-dose series) 11/01/2022   Pap Smear  05/01/2023   Flu Shot  08/16/2023   DTaP/Tdap/Td vaccine (2 - Td or Tdap) 07/26/2027   Colon Cancer Screening  02/24/2032   Hepatitis C Screening  Completed   HIV Screening  Completed   Meningitis B Vaccine  Aged Out    ALLERGIES: Patient has no known allergies.  No current outpatient medications on file prior to visit.   No current facility-administered medications on file prior to visit.    Review of Systems  Constitutional:  Negative for chills and fever.  Respiratory:  Negative for cough.   Cardiovascular:  Negative for chest pain and palpitations.  Gastrointestinal:  Negative for nausea and vomiting.      Objective:    BP 126/74   Pulse 74   Temp 98.6 F (37 C) (Oral)   Ht 5' 7 (1.702 m)   Wt 173 lb (78.5 kg)   LMP  (LMP Unknown)   SpO2 99%   BMI 27.10 kg/m  BP Readings from Last 3 Encounters:  07/29/23 126/74  07/09/22 128/80  05/01/22 124/78   Wt Readings from Last 3 Encounters:  07/29/23 173 lb (78.5 kg)  05/01/22 155 lb (70.3 kg)  03/21/22 152 lb 12.8 oz (69.3 kg)    Physical Exam Vitals reviewed.  Constitutional:      Appearance: Normal appearance. She is well-developed.  Eyes:     Conjunctiva/sclera: Conjunctivae normal.  Neck:     Thyroid : No thyroid  mass or thyromegaly.  Cardiovascular:     Rate and Rhythm: Normal rate and regular rhythm.     Pulses: Normal pulses.     Heart sounds: Normal heart sounds.  Pulmonary:     Effort: Pulmonary effort is normal.     Breath sounds: Normal breath sounds. No wheezing, rhonchi or rales.  Chest:  Breasts:    Breasts are symmetrical.     Right: No inverted nipple, mass,  nipple discharge, skin change or tenderness.     Left: No inverted nipple, mass, nipple discharge, skin change or tenderness.  Abdominal:     General: Bowel sounds are normal. There is no distension.     Palpations: Abdomen is soft. Abdomen is not rigid. There is no fluid wave or mass.     Tenderness: There is no abdominal tenderness. There is no guarding or rebound.  Genitourinary:    Cervix: No cervical motion tenderness, discharge or friability.     Uterus: Not enlarged, not fixed and not tender.      Adnexa:        Right: No mass, tenderness or fullness.         Left: No mass, tenderness or fullness.       Comments: Pap performed. No CMT. Unable to appreciated ovaries. Lymphadenopathy:     Head:     Right side of head: No submental, submandibular, tonsillar, preauricular, posterior auricular or occipital adenopathy.     Left side of head: No submental, submandibular, tonsillar, preauricular, posterior auricular or occipital adenopathy.     Cervical:     Right cervical: No superficial, deep or posterior cervical adenopathy.    Left cervical: No superficial, deep or posterior cervical adenopathy.     Upper Body:     Right upper body: No pectoral adenopathy.     Left upper body: No pectoral adenopathy.  Skin:    General: Skin is warm and dry.  Neurological:     Mental Status: She is alert.  Psychiatric:        Speech: Speech normal.        Behavior: Behavior normal.        Thought Content: Thought content normal.

## 2023-08-04 NOTE — Assessment & Plan Note (Signed)
 Insurance will not cover for GLP-1 agonist at this time.  We agreed metformin  is a prudent choice.  Discussed mechanism of action, titration and side effects.

## 2023-08-04 NOTE — Assessment & Plan Note (Signed)
Clinical breast exam performed today.  Pap smear obtained.  Encouraged continued exercise. 

## 2023-08-04 NOTE — Assessment & Plan Note (Signed)
 Overdue for surveillance.  New referral placed to endocrinology.

## 2023-08-05 ENCOUNTER — Other Ambulatory Visit: Payer: Self-pay

## 2023-08-05 ENCOUNTER — Ambulatory Visit: Payer: Self-pay | Admitting: Family

## 2023-08-05 DIAGNOSIS — R87610 Atypical squamous cells of undetermined significance on cytologic smear of cervix (ASC-US): Secondary | ICD-10-CM

## 2023-08-05 DIAGNOSIS — E559 Vitamin D deficiency, unspecified: Secondary | ICD-10-CM

## 2023-08-05 LAB — CYTOLOGY - PAP
Comment: NEGATIVE
Diagnosis: UNDETERMINED — AB
High risk HPV: NEGATIVE

## 2023-08-05 MED ORDER — CHOLECALCIFEROL 1.25 MG (50000 UT) PO CAPS: 50000.0000 [IU] | ORAL_CAPSULE | ORAL | 0 refills | Status: DC

## 2023-08-05 MED ORDER — CHOLECALCIFEROL 1.25 MG (50000 UT) PO CAPS
50000.0000 [IU] | ORAL_CAPSULE | ORAL | 0 refills | Status: DC
  Filled 2023-08-05: qty 8, 56d supply, fill #0

## 2023-08-07 MED ORDER — CHOLECALCIFEROL 1.25 MG (50000 UT) PO CAPS: 50000.0000 [IU] | ORAL_CAPSULE | ORAL | 0 refills | Status: AC

## 2023-08-14 ENCOUNTER — Ambulatory Visit (INDEPENDENT_AMBULATORY_CARE_PROVIDER_SITE_OTHER)

## 2023-08-14 ENCOUNTER — Other Ambulatory Visit: Payer: Self-pay

## 2023-08-14 ENCOUNTER — Ambulatory Visit
Admission: RE | Admit: 2023-08-14 | Discharge: 2023-08-14 | Disposition: A | Discharge status: Home / Self Care | Attending: Internal Medicine | Admitting: Internal Medicine

## 2023-08-14 VITALS — BP 128/83 | HR 79 | Temp 98.2°F | Resp 16

## 2023-08-14 DIAGNOSIS — L03011 Cellulitis of right finger: Secondary | ICD-10-CM

## 2023-08-14 DIAGNOSIS — M79644 Pain in right finger(s): Secondary | ICD-10-CM

## 2023-08-14 MED ORDER — MUPIROCIN 2 % EX OINT: 1.0000 | TOPICAL_OINTMENT | Freq: Three times a day (TID) | CUTANEOUS | 0 refills | Status: AC

## 2023-08-14 MED ORDER — DOXYCYCLINE HYCLATE 100 MG PO CAPS: 100.0000 mg | ORAL_CAPSULE | Freq: Two times a day (BID) | ORAL | 0 refills | Status: AC

## 2023-08-14 NOTE — ED Notes (Signed)
 Bulky dressing with 4x4 and kling gauze. Scant amount of bleeding. Patient tolerated well.

## 2023-08-14 NOTE — ED Provider Notes (Signed)
 [facility]  Note:  This document was prepared using Dragon voice recognition software and may include unintentional dictation errors.  MRN: [id number] DOB: 10-30-1975 DATE: 08/14/23   Subjective:  Chief Complaint:  Chief Complaint  Patient presents with   Nail Problem    Right index finger pain and swelling x 1 week. Pain is under nail bed - Entered by patient   Hand Pain     HPI: Aimee Medina is a 48 y.o. female presenting for pain in her right 2nd digit for the past week. Patient states about one week ago she noticed that she was starting to have pain at the tip of her right 2nd digit. She states that she often gets her nails down with a powder polish. She went and had the polish removed to better examine the nail and noticed there was an area of discoloration in the nail. She states there was no pain when the polish was removed. She had her nails repainted and the pain has since gotten worse. She reports increased swelling and pain to the distal right index finger. She reports redness as well as a discoloration at the tip of her finger. She does not bite her nails and does not wear acrylic nails.  Patient is right-hand dominant.  No known injury.  Denies fever, nausea/vomiting, numbness/tingling. Endorses pain in finger, nail discoloration. Presents NAD.  Prior to Admission medications   Medication Sig Start Date End Date Taking? Authorizing Provider  Cholecalciferol  1.25 MG (50000 UT) capsule Take 1 capsule (50,000 Units total) by mouth once a week. 08/07/23   Dineen Rollene MATSU, FNP  metFORMIN  (GLUCOPHAGE -XR) 500 MG 24 hr tablet Take 1 tablet (500 mg total) by mouth every evening. 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 damian;  and pituitary adenoma ;   treated w/ cabergoline    Pituitary adenoma (HCC) 07/2018   Psoriasis     Seasonal and perennial allergic rhinitis 01/21/2015   Vitamin D  deficiency 03/27/2017     Past Surgical History:  Procedure Laterality Date   ABDOMINOPLASTY  2015   APPENDECTOMY  09/2009   EXCISION OF SKIN TAG N/A 03/21/2022   Procedure: EXCISION OF SKIN TAG AND ANAL PAPILLA;  Surgeon: Debby Hila, MD;  Location: [facility];  Service: General;  Laterality: N/A;    Family History  Problem Relation Age of Onset   High blood pressure Mother    Heart disease Mother    Stroke Mother        brain aneursym   Hypertension Father    Stroke Father    Prostate cancer Father 26       prostate- currently in remission   High Cholesterol Father    Pancreatic cancer Maternal Grandmother 54   Colon cancer Maternal Grandfather 69   Prostate cancer Paternal Grandfather 1   Allergies Daughter    Allergies Son    Breast cancer Neg Hx    Thyroid  cancer Neg Hx    Colon polyps Neg Hx    Esophageal cancer Neg Hx    Stomach cancer Neg Hx    Rectal cancer Neg Hx     Social History   Tobacco Use   Smoking status: Never   Smokeless tobacco: Never  Vaping Use   Vaping status: Never  Used  Substance Use Topics   Alcohol use: No    Alcohol/week: 0.0 standard drinks of alcohol    Comment: very rare   Drug use: No    Review of Systems  Constitutional:  Negative for fever.  Gastrointestinal:  Negative for nausea and vomiting.  Musculoskeletal:  Positive for arthralgias.  Skin:        Nail discoloration     Objective:   Vitals: BP 128/83 (BP Location: Right Arm)   Pulse 79   Temp 98.2 F (36.8 C) (Oral)   Resp 16   LMP 08/08/2023   SpO2 98%   Physical Exam Constitutional:      General: She is not in acute distress.    Appearance: Normal appearance. She is well-developed and normal weight. She is not ill-appearing or toxic-appearing.  HENT:     Head: Normocephalic and atraumatic.  Cardiovascular:     Rate and Rhythm: Normal rate and regular rhythm.      Pulses:          Radial pulses are 2+ on the right side and 2+ on the left side.     Heart sounds: Normal heart sounds.  Pulmonary:     Effort: Pulmonary effort is normal.     Breath sounds: Normal breath sounds.     Comments: Clear to auscultation bilaterally  Abdominal:     General: Bowel sounds are normal.     Palpations: Abdomen is soft.     Tenderness: There is no abdominal tenderness.  Skin:    General: Skin is warm and dry.     Findings: Abscess and erythema present.     Comments: Patient's distal right 2nd digit is TTP and erythematous. There is an area of pus accumulation noted under the nail itself. No discharge or warmth. NV intact. Full ROM.   Neurological:     General: No focal deficit present.     Mental Status: She is alert.  Psychiatric:        Mood and Affect: Mood and affect normal.     Results:  Labs: No results found for this or any previous visit (from the past 24 hours).  Radiology: DG Hand Complete Right Result Date: 08/14/2023 CLINICAL DATA:  Right second digit pain EXAM: RIGHT HAND - COMPLETE 3+ VIEW COMPARISON:  None Available. FINDINGS: There is no evidence of fracture or dislocation. There is no evidence of arthropathy or other focal bone abnormality. Soft tissues are unremarkable. IMPRESSION: Negative. Electronically Signed   By: Megan  Zare M.D.   On: 08/14/2023 17:49     UC Course/Treatments:  Procedures: Incision and Drainage  Date/Time: 08/14/2023 6:10 PM  Performed by: Basilia Ulanda SQUIBB, PA-C Authorized by: Basilia Ulanda SQUIBB, PA-C   Consent:    Consent obtained:  Verbal   Consent given by:  Patient   Risks discussed:  Pain and incomplete drainage   Alternatives discussed:  Alternative treatment and referral Universal protocol:    Patient identity confirmed:  Verbally with patient Location:    Type:  Abscess   Location:  Upper extremity   Upper extremity location:  Finger   Finger location:  R index finger Anesthesia:     Anesthesia method:  Nerve block   Block location:  Right 2nd digit   Block needle gauge:  27 G   Block anesthetic:  Lidocaine  2% w/o epi   Block injection procedure:  Anatomic landmarks identified, anatomic landmarks palpated and introduced needle   Block outcome:  Anesthesia achieved  Procedure type:    Complexity:  Simple Procedure details:    Incision types:  Stab incision   Drainage:  Purulent and bloody   Drainage amount:  Moderate   Wound treatment:  Wound left open   Packing materials:  None Post-procedure details:    Procedure completion:  Tolerated well, no immediate complications    Medications Ordered in UC: Medications - No data to display   Assessment and Plan :     ICD-10-CM   1. Cellulitis of finger of right hand  L03.011     2. Pain in finger of right hand  M79.644 DG Hand Complete Right    DG Hand Complete Right     Cellulitis of finger of right hand Pain in finger of right hand Afebrile, nontoxic-appearing, NAD. VSS. DDX includes but not limited to: Cellulitis, paronychia, felon, abscess Imaging was negative for foreign body or fracture.  A pocket of purulent discharge was noted under the fingernail of the right second digit.  Digital block achieved with lidocaine  1% without epi.  I&D performed.  Purulent discharge expressed.  Dressing applied.  Wound care instructions provided.  Doxycycline  100 mg twice daily as well as mupirocin  1 application 3 times daily was prescribed. Strict ED precautions were given and patient verbalized understanding.  ED Discharge Orders          Ordered    doxycycline  (VIBRAMYCIN ) 100 MG capsule  2 times daily        08/14/23 1807    mupirocin  ointment (BACTROBAN ) 2 %  3 times daily        08/14/23 1807             PDMP not reviewed this encounter.     Basilia Ulanda SQUIBB, PA-C 08/14/23 1812

## 2023-08-14 NOTE — ED Triage Notes (Signed)
 C/O pain, redness and swelling to right index finger fort one week. Patient denies injury, fever or chills. Patient has gel polish on finger nails and states she has pain under the nail as well.

## 2023-08-14 NOTE — Discharge Instructions (Signed)
 I have sent an antibiotic (Doxycycline ) for you to take by mouth for your finger as well as an antibiotic ointment (Bactroban ). These are often used to treat skin infections. Take them as directed. It is very important for you to pay attention to any new symptoms or worsening of your current condition. Please go directly to the Emergency Department immediately should you begin to feel worse in any way or have any of the following symptoms: persistent fevers, increased pain, increased swelling, increased redness, redness outside the demarcation or streaking up your finger/hand.  Keep your wound clean and as dry as possible. Do not immerse or soak your wound in water. This means no swimming, washing dishes (unless thick rubber gloves are used), baths, or hot tubs until the area has healed. Leave original bandages on for the first 24 hours. After this time, showering or rinsing is recommended, rather than bathing. After the first 24 hours, remove old bandages and gently cleanse your wound with soap and water.

## 2023-11-26 ENCOUNTER — Encounter: Admitting: Family

## 2024-02-18 ENCOUNTER — Telehealth: Admitting: "Endocrinology

## 2024-08-03 ENCOUNTER — Encounter: Admitting: Family
